# Patient Record
Sex: Female | Born: 1945
Health system: Southern US, Community
[De-identification: ages and names within clinical notes are randomized; demographics above are authoritative.]

## PROBLEM LIST (undated history)

## (undated) DIAGNOSIS — Z8489 Family history of other specified conditions: Secondary | ICD-10-CM

## (undated) DIAGNOSIS — R600 Localized edema: Secondary | ICD-10-CM

## (undated) DIAGNOSIS — I10 Essential (primary) hypertension: Secondary | ICD-10-CM

## (undated) DIAGNOSIS — M1990 Unspecified osteoarthritis, unspecified site: Secondary | ICD-10-CM

---

## 2002-01-19 ENCOUNTER — Ambulatory Visit (HOSPITAL_COMMUNITY): Admission: RE | Admit: 2002-01-19 | Discharge: 2002-01-19 | Payer: Self-pay | Admitting: Obstetrics and Gynecology

## 2002-01-19 ENCOUNTER — Encounter: Payer: Self-pay | Admitting: Obstetrics and Gynecology

## 2003-01-22 ENCOUNTER — Ambulatory Visit (HOSPITAL_COMMUNITY): Admission: RE | Admit: 2003-01-22 | Discharge: 2003-01-22 | Payer: Self-pay | Admitting: Obstetrics and Gynecology

## 2003-01-22 ENCOUNTER — Encounter: Payer: Self-pay | Admitting: Obstetrics and Gynecology

## 2010-08-21 ENCOUNTER — Encounter
Admission: RE | Admit: 2010-08-21 | Discharge: 2010-08-21 | Payer: Self-pay | Source: Home / Self Care | Attending: Internal Medicine | Admitting: Internal Medicine

## 2011-04-14 ENCOUNTER — Other Ambulatory Visit: Payer: Self-pay | Admitting: Internal Medicine

## 2011-04-14 DIAGNOSIS — Z1231 Encounter for screening mammogram for malignant neoplasm of breast: Secondary | ICD-10-CM

## 2011-08-23 ENCOUNTER — Ambulatory Visit
Admission: RE | Admit: 2011-08-23 | Discharge: 2011-08-23 | Disposition: A | Discharge status: Home / Self Care | Payer: Medicare Other | Source: Ambulatory Visit | Attending: Internal Medicine | Admitting: Internal Medicine

## 2011-08-23 DIAGNOSIS — Z1231 Encounter for screening mammogram for malignant neoplasm of breast: Secondary | ICD-10-CM

## 2012-07-27 ENCOUNTER — Other Ambulatory Visit: Payer: Self-pay | Admitting: Internal Medicine

## 2012-07-27 DIAGNOSIS — Z1231 Encounter for screening mammogram for malignant neoplasm of breast: Secondary | ICD-10-CM

## 2012-09-04 ENCOUNTER — Ambulatory Visit: Payer: Medicare Other

## 2012-10-05 ENCOUNTER — Ambulatory Visit: Payer: Medicare Other

## 2012-11-07 ENCOUNTER — Ambulatory Visit
Admission: RE | Admit: 2012-11-07 | Discharge: 2012-11-07 | Disposition: A | Discharge status: Home / Self Care | Payer: Medicare Other | Source: Ambulatory Visit | Attending: Internal Medicine | Admitting: Internal Medicine

## 2012-11-07 DIAGNOSIS — Z1231 Encounter for screening mammogram for malignant neoplasm of breast: Secondary | ICD-10-CM

## 2013-11-05 ENCOUNTER — Other Ambulatory Visit: Payer: Self-pay

## 2013-11-05 DIAGNOSIS — Z1231 Encounter for screening mammogram for malignant neoplasm of breast: Secondary | ICD-10-CM

## 2013-11-27 ENCOUNTER — Encounter (INDEPENDENT_AMBULATORY_CARE_PROVIDER_SITE_OTHER): Payer: Self-pay

## 2013-11-27 ENCOUNTER — Ambulatory Visit
Admission: RE | Admit: 2013-11-27 | Discharge: 2013-11-27 | Disposition: A | Discharge status: Home / Self Care | Payer: Medicare Other | Source: Ambulatory Visit

## 2013-11-27 DIAGNOSIS — Z1231 Encounter for screening mammogram for malignant neoplasm of breast: Secondary | ICD-10-CM

## 2014-08-05 DIAGNOSIS — E559 Vitamin D deficiency, unspecified: Secondary | ICD-10-CM | POA: Diagnosis not present

## 2014-08-05 DIAGNOSIS — I10 Essential (primary) hypertension: Secondary | ICD-10-CM | POA: Diagnosis not present

## 2014-08-05 DIAGNOSIS — Z1389 Encounter for screening for other disorder: Secondary | ICD-10-CM | POA: Diagnosis not present

## 2014-08-05 DIAGNOSIS — Z23 Encounter for immunization: Secondary | ICD-10-CM | POA: Diagnosis not present

## 2014-08-05 DIAGNOSIS — E538 Deficiency of other specified B group vitamins: Secondary | ICD-10-CM | POA: Diagnosis not present

## 2014-08-05 DIAGNOSIS — Z0000 Encounter for general adult medical examination without abnormal findings: Secondary | ICD-10-CM | POA: Diagnosis not present

## 2014-08-10 DIAGNOSIS — H52221 Regular astigmatism, right eye: Secondary | ICD-10-CM | POA: Diagnosis not present

## 2014-08-10 DIAGNOSIS — H524 Presbyopia: Secondary | ICD-10-CM | POA: Diagnosis not present

## 2014-08-10 DIAGNOSIS — H5203 Hypermetropia, bilateral: Secondary | ICD-10-CM | POA: Diagnosis not present

## 2014-10-30 DIAGNOSIS — E559 Vitamin D deficiency, unspecified: Secondary | ICD-10-CM | POA: Diagnosis not present

## 2014-10-30 DIAGNOSIS — I10 Essential (primary) hypertension: Secondary | ICD-10-CM | POA: Diagnosis not present

## 2014-10-30 DIAGNOSIS — E785 Hyperlipidemia, unspecified: Secondary | ICD-10-CM | POA: Diagnosis not present

## 2014-10-30 DIAGNOSIS — E538 Deficiency of other specified B group vitamins: Secondary | ICD-10-CM | POA: Diagnosis not present

## 2014-10-30 DIAGNOSIS — I5032 Chronic diastolic (congestive) heart failure: Secondary | ICD-10-CM | POA: Diagnosis not present

## 2015-01-13 ENCOUNTER — Other Ambulatory Visit: Payer: Self-pay

## 2015-01-13 DIAGNOSIS — Z1231 Encounter for screening mammogram for malignant neoplasm of breast: Secondary | ICD-10-CM

## 2015-01-24 DIAGNOSIS — E538 Deficiency of other specified B group vitamins: Secondary | ICD-10-CM | POA: Diagnosis not present

## 2015-01-24 DIAGNOSIS — I10 Essential (primary) hypertension: Secondary | ICD-10-CM | POA: Diagnosis not present

## 2015-01-24 DIAGNOSIS — E559 Vitamin D deficiency, unspecified: Secondary | ICD-10-CM | POA: Diagnosis not present

## 2015-01-31 DIAGNOSIS — N182 Chronic kidney disease, stage 2 (mild): Secondary | ICD-10-CM | POA: Diagnosis not present

## 2015-01-31 DIAGNOSIS — E785 Hyperlipidemia, unspecified: Secondary | ICD-10-CM | POA: Diagnosis not present

## 2015-01-31 DIAGNOSIS — I129 Hypertensive chronic kidney disease with stage 1 through stage 4 chronic kidney disease, or unspecified chronic kidney disease: Secondary | ICD-10-CM | POA: Diagnosis not present

## 2015-01-31 DIAGNOSIS — I5032 Chronic diastolic (congestive) heart failure: Secondary | ICD-10-CM | POA: Diagnosis not present

## 2015-02-06 ENCOUNTER — Ambulatory Visit
Admission: RE | Admit: 2015-02-06 | Discharge: 2015-02-06 | Disposition: A | Discharge status: Home / Self Care | Payer: Medicare Other | Source: Ambulatory Visit

## 2015-02-06 DIAGNOSIS — Z1231 Encounter for screening mammogram for malignant neoplasm of breast: Secondary | ICD-10-CM | POA: Diagnosis not present

## 2015-07-30 DIAGNOSIS — N182 Chronic kidney disease, stage 2 (mild): Secondary | ICD-10-CM | POA: Diagnosis not present

## 2015-07-30 DIAGNOSIS — E538 Deficiency of other specified B group vitamins: Secondary | ICD-10-CM | POA: Diagnosis not present

## 2015-07-30 DIAGNOSIS — E785 Hyperlipidemia, unspecified: Secondary | ICD-10-CM | POA: Diagnosis not present

## 2015-07-30 DIAGNOSIS — I129 Hypertensive chronic kidney disease with stage 1 through stage 4 chronic kidney disease, or unspecified chronic kidney disease: Secondary | ICD-10-CM | POA: Diagnosis not present

## 2015-07-30 DIAGNOSIS — I5032 Chronic diastolic (congestive) heart failure: Secondary | ICD-10-CM | POA: Diagnosis not present

## 2015-08-06 DIAGNOSIS — I129 Hypertensive chronic kidney disease with stage 1 through stage 4 chronic kidney disease, or unspecified chronic kidney disease: Secondary | ICD-10-CM | POA: Diagnosis not present

## 2015-08-06 DIAGNOSIS — N182 Chronic kidney disease, stage 2 (mild): Secondary | ICD-10-CM | POA: Diagnosis not present

## 2015-08-06 DIAGNOSIS — E785 Hyperlipidemia, unspecified: Secondary | ICD-10-CM | POA: Diagnosis not present

## 2015-08-06 DIAGNOSIS — E559 Vitamin D deficiency, unspecified: Secondary | ICD-10-CM | POA: Diagnosis not present

## 2015-08-06 DIAGNOSIS — Z23 Encounter for immunization: Secondary | ICD-10-CM | POA: Diagnosis not present

## 2015-08-06 DIAGNOSIS — M1611 Unilateral primary osteoarthritis, right hip: Secondary | ICD-10-CM | POA: Diagnosis not present

## 2015-08-06 DIAGNOSIS — M25551 Pain in right hip: Secondary | ICD-10-CM | POA: Diagnosis not present

## 2015-08-15 DIAGNOSIS — M1611 Unilateral primary osteoarthritis, right hip: Secondary | ICD-10-CM | POA: Diagnosis not present

## 2015-10-02 DIAGNOSIS — M1611 Unilateral primary osteoarthritis, right hip: Secondary | ICD-10-CM | POA: Diagnosis not present

## 2015-10-20 ENCOUNTER — Ambulatory Visit: Payer: Self-pay | Admitting: Orthopedic Surgery

## 2015-10-20 DIAGNOSIS — M1611 Unilateral primary osteoarthritis, right hip: Secondary | ICD-10-CM | POA: Diagnosis not present

## 2015-10-20 NOTE — H&P (Signed)
Victoria Owens DOB: 08-14-1945 Undefined / Language: [place] / Race: White Female Date of Admission:  11/10/2015 CC:  Right Hip Pain History of Present Illness The patient is a 70 year old female who comes in for a preoperative History and Physical. The patient is scheduled for a right total hip arthroplasty (anterior) to be performed by Dr. Gus RankinFrank V. Aluisio, MD at [facility] on 11-10-2015. The patient is a 70 year old female who presented with a hip problem. The patient was seen in referral from Dr. Penni BombardKendall.The patient reports right lateral hip problems including pain, tenderness and stiffness symptoms that have been present for year(s). The symptoms began without any known injury. Symptoms reported include: hip pain, night pain (inability to lie on right side), grinding, difficulty flexing hip, difficulty rotating hip, difficulty bearing weight and difficulty ambulating, while reported symptoms do not include fever, chills, warmth, redness or burning The patient describes the hip problem as dull and aching. Onset of symptoms was with symptoms now occuring constantly. The symptoms are described as moderate in severity.The patient feels as if their symptoms are notes that they are unchanged. Symptoms are exacerbated by movement, flexing hip, weight bearing and lying on the affected side. Symptoms are relieved by nonsteroidal anti-inflammatory drugs (topicals). The patient is not currently being treated for this problem. Pertinent medical history includes rheumatoid arthritis. Previous workup for this problem has included hip x-rays (ordered by pcp, and available on the [facility] system). Previous treatment for this problem has included corticosteroid injection (troch bursa injections). Unfortunately, her hips are causing extreme difficulty for her. She has pain at all times, including at rest. She is having pain at night. Her right hip is worse than her left hip and both are very painful. She is at a  stage now where it is having a very negative effect on her lifestyle. She states she cannot really tolerate the pain anymore. She is ready to proceed with surgery at this time. They have been treated conservatively in the past for the above stated problem and despite conservative measures, they continue to have progressive pain and severe functional limitations and dysfunction. They have failed non-operative management including home exercise, medications. It is felt that they would benefit from undergoing total joint replacement. Risks and benefits of the procedure have been discussed with the patient and they elect to proceed with surgery. There are no active contraindications to surgery such as ongoing infection or rapidly progressive neurological disease.   Problem List/Past Medical Problems Reconciled  Primary osteoarthritis of right hip (M16.11)  Hypertension  Allergies  Penicillins  Rash.  Family History Hypertension  mother Osteoarthritis  mother  Social History Alcohol use  never consumed alcohol Children  1 Current work status  retired Financial plannerDrug/Alcohol Rehab (Currently)  no Drug/Alcohol Rehab (Previously)  no Exercise  Exercises weekly; does running / walking Illicit drug use  no Living situation  live alone Marital status  single Pain Contract  no Tobacco / smoke exposure  no Tobacco use  never smoker Merchant navy officerAdvance Directives  Living Will, Healthcare POA  Medication History Multivitamin (Oral) Active. Vitamin C (Oral) Specific strength unknown - Active. Vitamin D3 (Oral) Specific strength unknown - Active. Pravastatin Sodium (10MG  Tablet, Oral) Active. HydroCHLOROthiazide (12.5MG  Capsule, Oral) Active. Ginkoba (40MG  Tablet, Oral) Active.  Past Surgical History No pertinent past surgical history    Review of Systems General Present- Fatigue. Not Present- Chills, Fever, Memory Loss, Night Sweats, Weight Gain and Weight Loss. Skin Not Present-  Eczema,  Hives, Itching, Lesions and Rash. HEENT Present- Headache. Not Present- Dentures, Double Vision, Hearing Loss, Tinnitus and Visual Loss. Respiratory Not Present- Allergies, Chronic Cough, Coughing up blood, Shortness of breath at rest and Shortness of breath with exertion. Cardiovascular Not Present- Chest Pain, Difficulty Breathing Lying Down, Murmur, Palpitations, Racing/skipping heartbeats and Swelling. Gastrointestinal Not Present- Abdominal Pain, Bloody Stool, Constipation, Diarrhea, Difficulty Swallowing, Heartburn, Jaundice, Loss of appetitie, Nausea and Vomiting. Female Genitourinary Not Present- Blood in Urine, Discharge, Flank Pain, Incontinence, Painful Urination, Urgency, Urinary frequency, Urinary Retention, Urinating at Night and Weak urinary stream. Musculoskeletal Present- Back Pain, Joint Pain, Joint Swelling, Morning Stiffness, Muscle Pain and Muscle Weakness. Not Present- Spasms. Neurological Present- Difficulty with balance, Dizziness and Weakness. Not Present- Blackout spells, Paralysis and Tremor. Psychiatric Not Present- Insomnia.  Vitals  Weight: 102 lb Height: 63in Body Surface Area: 1.45 m Body Mass Index: 18.07 kg/m  Pulse: 68 (Regular)  BP: 128/62 (Sitting, Left Arm, Standard)  Physical Exam General Mental Status -Alert, cooperative and good historian. General Appearance-pleasant, Not in acute distress. Orientation-Oriented X3. Build & Nutrition-Petite, Well nourished and Well developed.  Head and Neck Head-normocephalic, atraumatic . Neck Global Assessment - supple, no bruit auscultated on the right, no bruit auscultated on the left.  Eye Vision-Wears corrective lenses. Pupil - Bilateral-Regular and Round. Motion - Bilateral-EOMI.  ENMT Note: upper and lower dentures   Chest and Lung Exam Auscultation Breath sounds - clear at anterior chest wall and clear at posterior chest wall. Adventitious sounds - No  Adventitious sounds.  Cardiovascular Auscultation Rhythm - Regular rate and rhythm. Heart Sounds - S1 WNL and S2 WNL. Murmurs & Other Heart Sounds - Auscultation of the heart reveals - No Murmurs.  Abdomen Palpation/Percussion Tenderness - Abdomen is non-tender to palpation. Rigidity (guarding) - Abdomen is soft. Auscultation Auscultation of the abdomen reveals - Bowel sounds normal.  Female Genitourinary Note: Not done, not pertinent to present illness   Musculoskeletal Note: On exam, she is alert and oriented, in no apparent distress. Evaluation of her left hip shows flexion to 90. No internal rotation, about 10 degrees of external rotation, and 20 degrees of abduction. Right hip flexion is 70. No internal or external rotation. No abduction or adduction. Knee exams are normal. Pulse, sensation, and motor are intact. Gait pattern is severely antalgic.  RADIOGRAPHS AP pelvis, AP and lateral of the hips show that she has severe end-stage bone on bone arthritis of the right, worse than left hip. She has got a massive osteophyte formation and the hip is almost ankylosed on both sides, but the right is a little worse. She also has a large subchondral cysts on both sides.  Assessment & Plan  Primary osteoarthritis of right hip (M16.11)  Note:Surgical Plans: Right Total Hip Replacement - Anterior Approach  Disposition: Home  PCP: Dr. Shary DecampBrian McKenzie  IV TXA  Anesthesia Issues: NONE  Signed electronically by Beckey RutterAlezandrew L Arnett Duddy, III PA-C

## 2015-10-22 ENCOUNTER — Ambulatory Visit: Payer: Self-pay | Admitting: Orthopedic Surgery

## 2015-10-29 DIAGNOSIS — E559 Vitamin D deficiency, unspecified: Secondary | ICD-10-CM | POA: Diagnosis not present

## 2015-10-29 DIAGNOSIS — I129 Hypertensive chronic kidney disease with stage 1 through stage 4 chronic kidney disease, or unspecified chronic kidney disease: Secondary | ICD-10-CM | POA: Diagnosis not present

## 2015-10-29 DIAGNOSIS — E538 Deficiency of other specified B group vitamins: Secondary | ICD-10-CM | POA: Diagnosis not present

## 2015-10-29 DIAGNOSIS — N390 Urinary tract infection, site not specified: Secondary | ICD-10-CM | POA: Diagnosis not present

## 2015-10-31 NOTE — Progress Notes (Signed)
10-31-15 1705 Please review MD epic orders Scheduled procedure is left - Epic MD orders state Right Total Hip  -please clarify.

## 2015-10-31 NOTE — Patient Instructions (Addendum)
Victoria Owens  10/31/2015   Your procedure is scheduled on: 11-10-15  [facility]  [facility] to 3rd floor to  Short Stay Center at   1230 PM.  Call this number if you have problems the morning of surgery [phone]   Remember: ONLY 1 PERSON MAY GO WITH YOU TO SHORT STAY TO GET  READY MORNING OF YOUR SURGERY.  Do not eat food or drink liquids :After Midnight.     Take these medicines the morning of surgery with A SIP OF WATER: None. DO NOT TAKE ANY DIABETIC MEDICATIONS DAY OF YOUR SURGERY                               You may not have any metal on your body including hair pins and              piercings  Do not wear jewelry, make-up, lotions, powders or perfumes, deodorant             Do not wear nail polish.  Do not shave  48 hours prior to surgery.              Men may shave face and neck.   Do not bring valuables to the hospital. [facility] IS NOT             RESPONSIBLE   FOR VALUABLES.  Contacts, dentures or bridgework may not be worn into surgery.  Leave suitcase in the car. After surgery it may be brought to your room.     Patients discharged the day of surgery will not be allowed to drive home.  Name and phone number of your driver: Morene CrockerMarshall Green, brother- [phone]  Special Instructions: N/A              Please read over the following fact sheets you were given: _____________________________________________________________________             [facility] - Preparing [facility] Before surgery, you can play an important role.  Because skin is not sterile, your skin needs to be as free of germs as possible.  You can reduce the number of germs on your skin by washing with CHG (chlorahexidine gluconate) soap before surgery.  CHG is an antiseptic cleaner which kills germs and bonds with the skin to continue killing germs even after washing. Please DO NOT use if you have an allergy to CHG or antibacterial  soaps.  If your skin becomes reddened/irritated stop using the CHG and inform your nurse when you arrive at Short Stay. Do not shave (including legs and underarms) for at least 48 hours prior to the first CHG shower.  You may shave your face/neck. Please follow these instructions carefully:  1.  Shower with CHG Soap the night before surgery and the  morning of Surgery.  2.  If you choose to wash your hair, wash your hair first as usual with your  normal  shampoo.  3.  After you shampoo, rinse your hair and body thoroughly to remove the  shampoo.                           4.  Use CHG as you would any other liquid soap.  You can apply chg directly  to  the skin and wash                       Gently with a scrungie or clean washcloth.  5.  Apply the CHG Soap to your body ONLY FROM THE NECK DOWN.   Do not use on face/ open                           Wound or open sores. Avoid contact with eyes, ears mouth and genitals (private parts).                       Wash face,  Genitals (private parts) with your normal soap.             6.  Wash thoroughly, paying special attention to the area where your surgery  will be performed.  7.  Thoroughly rinse your body with warm water from the neck down.  8.  DO NOT shower/wash with your normal soap after using and rinsing off  the CHG Soap.                9.  Pat yourself dry with a clean towel.            10.  Wear clean pajamas.            11.  Place clean sheets on your bed the night of your first shower and do not  sleep with pets. Day of Surgery : Do not apply any lotions/deodorants the morning of surgery.  Please wear clean clothes to the hospital/surgery center.  FAILURE TO FOLLOW THESE INSTRUCTIONS MAY RESULT IN THE CANCELLATION OF YOUR SURGERY PATIENT SIGNATURE_________________________________  NURSE SIGNATURE__________________________________  ________________________________________________________________________   Adam Phenix  An  incentive spirometer is a tool that can help keep your lungs clear and active. This tool measures how well you are filling your lungs with each breath. Taking long deep breaths may help reverse or decrease the chance of developing breathing (pulmonary) problems (especially infection) following:  A long period of time when you are unable to move or be active. BEFORE THE PROCEDURE   If the spirometer includes an indicator to show your best effort, your nurse or respiratory therapist will set it to a desired goal.  If possible, sit up straight or lean slightly forward. Try not to slouch.  Hold the incentive spirometer in an upright position. INSTRUCTIONS FOR USE   Sit on the edge of your bed if possible, or sit up as far as you can in bed or on a chair.  Hold the incentive spirometer in an upright position.  Breathe out normally.  Place the mouthpiece in your mouth and seal your lips tightly around it.  Breathe in slowly and as deeply as possible, raising the piston or the ball toward the top of the column.  Hold your breath for 3-5 seconds or for as long as possible. Allow the piston or ball to fall to the bottom of the column.  Remove the mouthpiece from your mouth and breathe out normally.  Rest for a few seconds and repeat Steps 1 through 7 at least 10 times every 1-2 hours when you are awake. Take your time and take a few normal breaths between deep breaths.  The spirometer may include an indicator to show your best effort. Use the indicator as a goal to work toward during each repetition.  After each set  of 10 deep breaths, practice coughing to be sure your lungs are clear. If you have an incision (the cut made at the time of surgery), support your incision when coughing by placing a pillow or rolled up towels firmly against it. Once you are able to get out of bed, walk around indoors and cough well. You may stop using the incentive spirometer when instructed by your caregiver.   RISKS AND COMPLICATIONS  Take your time so you do not get dizzy or light-headed.  If you are in pain, you may need to take or ask for pain medication before doing incentive spirometry. It is harder to take a deep breath if you are having pain. AFTER USE  Rest and breathe slowly and easily.  It can be helpful to keep track of a log of your progress. Your caregiver can provide you with a simple table to help with this. If you are using the spirometer at home, follow these instructions: [place] IF:   You are having difficultly using the spirometer.  You have trouble using the spirometer as often as instructed.  Your pain medication is not giving enough relief while using the spirometer.  You develop fever of 100.5 F (38.1 C) or higher. SEEK IMMEDIATE MEDICAL CARE IF:   You cough up bloody sputum that had not been present before.  You develop fever of 102 F (38.9 C) or greater.  You develop worsening pain at or near the incision site. MAKE SURE YOU:   Understand these instructions.  Will watch your condition.  Will get help right away if you are not doing well or get worse. Document Released: 11/29/2006 Document Revised: 10/11/2011 Document Reviewed: 01/30/2007 ExitCare Patient Information 2014 ExitCare, [place].   ________________________________________________________________________  WHAT IS A BLOOD TRANSFUSION? Blood Transfusion Information  A transfusion is the replacement of blood or some of its parts. Blood is made up of multiple cells which provide different functions.  Red blood cells carry oxygen and are used for blood loss replacement.  White blood cells fight against infection.  Platelets control bleeding.  Plasma helps clot blood.  Other blood products are available for specialized needs, such as hemophilia or other clotting disorders. BEFORE THE TRANSFUSION  Who gives blood for transfusions?   Healthy volunteers who are fully evaluated  to make sure their blood is safe. This is blood bank blood. Transfusion therapy is the safest it has ever been in the practice of medicine. Before blood is taken from a donor, a complete history is taken to make sure that person has no history of diseases nor engages in risky social behavior (examples are intravenous drug use or sexual activity with multiple partners). The donor's travel history is screened to minimize risk of transmitting infections, such as malaria. The donated blood is tested for signs of infectious diseases, such as HIV and hepatitis. The blood is then tested to be sure it is compatible with you in order to minimize the chance of a transfusion reaction. If you or a relative donates blood, this is often done in anticipation of surgery and is not appropriate for emergency situations. It takes many days to process the donated blood. RISKS AND COMPLICATIONS Although transfusion therapy is very safe and saves many lives, the main dangers of transfusion include:   Getting an infectious disease.  Developing a transfusion reaction. This is an allergic reaction to something in the blood you were given. Every precaution is taken to prevent this. The decision to have a  blood transfusion has been considered carefully by your caregiver before blood is given. Blood is not given unless the benefits outweigh the risks. AFTER THE TRANSFUSION  Right after receiving a blood transfusion, you will usually feel much better and more energetic. This is especially true if your red blood cells have gotten low (anemic). The transfusion raises the level of the red blood cells which carry oxygen, and this usually causes an energy increase.  The nurse administering the transfusion will monitor you carefully for complications. HOME CARE INSTRUCTIONS  No special instructions are needed after a transfusion. You may find your energy is better. Speak with your caregiver about any limitations on activity for  underlying diseases you may have. SEEK MEDICAL CARE IF:   Your condition is not improving after your transfusion.  You develop redness or irritation at the intravenous (IV) site. SEEK IMMEDIATE MEDICAL CARE IF:  Any of the following symptoms occur over the next 12 hours:  Shaking chills.  You have a temperature by mouth above 102 F (38.9 C), not controlled by medicine.  Chest, back, or muscle pain.  People around you feel you are not acting correctly or are confused.  Shortness of breath or difficulty breathing.  Dizziness and fainting.  You get a rash or develop hives.  You have a decrease in urine output.  Your urine turns a dark color or changes to pink, red, or brown. Any of the following symptoms occur over the next 10 days:  You have a temperature by mouth above 102 F (38.9 C), not controlled by medicine.  Shortness of breath.  Weakness after normal activity.  The white part of the eye turns yellow (jaundice).  You have a decrease in the amount of urine or are urinating less often.  Your urine turns a dark color or changes to pink, red, or brown. Document Released: 07/16/2000 Document Revised: 10/11/2011 Document Reviewed: 03/04/2008 [facility] Patient Information 2014 [place], [place].  _______________________________________________________________________

## 2015-11-03 ENCOUNTER — Encounter (HOSPITAL_COMMUNITY)
Admission: RE | Admit: 2015-11-03 | Discharge: 2015-11-03 | Disposition: A | Discharge status: Home / Self Care | Payer: Medicare Other | Source: Ambulatory Visit | Attending: Orthopedic Surgery | Admitting: Orthopedic Surgery

## 2015-11-03 ENCOUNTER — Encounter (HOSPITAL_COMMUNITY): Payer: Self-pay

## 2015-11-03 DIAGNOSIS — I10 Essential (primary) hypertension: Secondary | ICD-10-CM | POA: Diagnosis not present

## 2015-11-03 DIAGNOSIS — Z01810 Encounter for preprocedural cardiovascular examination: Secondary | ICD-10-CM | POA: Diagnosis not present

## 2015-11-03 DIAGNOSIS — Z01812 Encounter for preprocedural laboratory examination: Secondary | ICD-10-CM | POA: Diagnosis not present

## 2015-11-03 HISTORY — DX: Unspecified osteoarthritis, unspecified site: M19.90

## 2015-11-03 HISTORY — DX: Essential (primary) hypertension: I10

## 2015-11-03 HISTORY — DX: Localized edema: R60.0

## 2015-11-03 HISTORY — DX: Family history of other specified conditions: Z84.89

## 2015-11-03 LAB — URINE MICROSCOPIC-ADD ON

## 2015-11-03 LAB — CBC
HEMATOCRIT: 38.8 % (ref 36.0–46.0)
HEMOGLOBIN: 13 g/dL (ref 12.0–15.0)
MCH: 29.7 pg (ref 26.0–34.0)
MCHC: 33.5 g/dL (ref 30.0–36.0)
MCV: 88.6 fL (ref 78.0–100.0)
Platelets: 257 10*3/uL (ref 150–400)
RBC: 4.38 MIL/uL (ref 3.87–5.11)
RDW: 12.7 % (ref 11.5–15.5)
WBC: 4.9 10*3/uL (ref 4.0–10.5)

## 2015-11-03 LAB — PROTIME-INR
INR: 0.99 (ref 0.00–1.49)
Prothrombin Time: 13.3 seconds (ref 11.6–15.2)

## 2015-11-03 LAB — URINALYSIS, ROUTINE W REFLEX MICROSCOPIC
Bilirubin Urine: NEGATIVE
Glucose, UA: NEGATIVE mg/dL
Ketones, ur: NEGATIVE mg/dL
Nitrite: NEGATIVE
Protein, ur: NEGATIVE mg/dL
SPECIFIC GRAVITY, URINE: 1.017 (ref 1.005–1.030)
pH: 6.5 (ref 5.0–8.0)

## 2015-11-03 LAB — COMPREHENSIVE METABOLIC PANEL
ALK PHOS: 82 U/L (ref 38–126)
ALT: 16 U/L (ref 14–54)
AST: 25 U/L (ref 15–41)
Albumin: 3.8 g/dL (ref 3.5–5.0)
Anion gap: 8 (ref 5–15)
BILIRUBIN TOTAL: 0.6 mg/dL (ref 0.3–1.2)
BUN: 15 mg/dL (ref 6–20)
CALCIUM: 9.2 mg/dL (ref 8.9–10.3)
CO2: 28 mmol/L (ref 22–32)
CREATININE: 0.87 mg/dL (ref 0.44–1.00)
Chloride: 106 mmol/L (ref 101–111)
GFR calc non Af Amer: >60 mL/min (ref >60)
Glucose, Bld: 100 mg/dL — ABNORMAL HIGH (ref 65–99)
Potassium: 3.2 mmol/L — ABNORMAL LOW (ref 3.5–5.1)
SODIUM: 142 mmol/L (ref 135–145)
TOTAL PROTEIN: 6.4 g/dL — AB (ref 6.5–8.1)

## 2015-11-03 LAB — ABO/RH: ABO/RH(D): B NEG

## 2015-11-03 LAB — SURGICAL PCR SCREEN
MRSA, PCR: NEGATIVE
STAPHYLOCOCCUS AUREUS: POSITIVE — AB

## 2015-11-03 LAB — APTT: aPTT: 25 seconds (ref 24–37)

## 2015-11-03 NOTE — Pre-Procedure Instructions (Signed)
11-03-15 1625 Pt. Notified of Positive Staph aureus and Mupirocin Ointment called to [facility] [address] [phone] -, will use as directed. Note viewable labs in Epic. Note urinalysis result. Note to fax [phone].

## 2015-11-04 NOTE — Pre-Procedure Instructions (Signed)
11-04-15 0930 Faxed not to Avel Peacerew [facility] on [phone], [phone] to clarify correct surgical site. Order set says right total hip, scheduled procedure is left total hip. Voice message to Meredith LeedsVelvet McBride to make aware clarification is needed for correct surgery site.

## 2015-11-05 DIAGNOSIS — E559 Vitamin D deficiency, unspecified: Secondary | ICD-10-CM | POA: Diagnosis not present

## 2015-11-05 DIAGNOSIS — Z1389 Encounter for screening for other disorder: Secondary | ICD-10-CM | POA: Diagnosis not present

## 2015-11-05 DIAGNOSIS — I129 Hypertensive chronic kidney disease with stage 1 through stage 4 chronic kidney disease, or unspecified chronic kidney disease: Secondary | ICD-10-CM | POA: Diagnosis not present

## 2015-11-05 DIAGNOSIS — Z0001 Encounter for general adult medical examination with abnormal findings: Secondary | ICD-10-CM | POA: Diagnosis not present

## 2015-11-05 DIAGNOSIS — E785 Hyperlipidemia, unspecified: Secondary | ICD-10-CM | POA: Diagnosis not present

## 2015-11-05 DIAGNOSIS — Z23 Encounter for immunization: Secondary | ICD-10-CM | POA: Diagnosis not present

## 2015-11-05 DIAGNOSIS — N182 Chronic kidney disease, stage 2 (mild): Secondary | ICD-10-CM | POA: Diagnosis not present

## 2015-11-05 DIAGNOSIS — Z01818 Encounter for other preprocedural examination: Secondary | ICD-10-CM | POA: Diagnosis not present

## 2015-11-10 ENCOUNTER — Inpatient Hospital Stay (HOSPITAL_COMMUNITY): Payer: Medicare Other | Admitting: Anesthesiology

## 2015-11-10 ENCOUNTER — Encounter (HOSPITAL_COMMUNITY): Payer: Self-pay | Admitting: *Deleted

## 2015-11-10 ENCOUNTER — Inpatient Hospital Stay (HOSPITAL_COMMUNITY): Payer: Medicare Other

## 2015-11-10 ENCOUNTER — Inpatient Hospital Stay (HOSPITAL_COMMUNITY)
Admission: RE | Admit: 2015-11-10 | Discharge: 2015-11-12 | DRG: 470 | Disposition: A | Discharge status: Home Health | Payer: Medicare Other | Source: Ambulatory Visit | Attending: Orthopedic Surgery | Admitting: Orthopedic Surgery

## 2015-11-10 ENCOUNTER — Encounter (HOSPITAL_COMMUNITY)
Admission: RE | Disposition: A | Discharge status: Home Health | Payer: Self-pay | Source: Ambulatory Visit | Attending: Orthopedic Surgery

## 2015-11-10 DIAGNOSIS — Z01812 Encounter for preprocedural laboratory examination: Secondary | ICD-10-CM

## 2015-11-10 DIAGNOSIS — Z471 Aftercare following joint replacement surgery: Secondary | ICD-10-CM | POA: Diagnosis not present

## 2015-11-10 DIAGNOSIS — I10 Essential (primary) hypertension: Secondary | ICD-10-CM | POA: Diagnosis present

## 2015-11-10 DIAGNOSIS — M169 Osteoarthritis of hip, unspecified: Secondary | ICD-10-CM | POA: Diagnosis present

## 2015-11-10 DIAGNOSIS — Z96641 Presence of right artificial hip joint: Secondary | ICD-10-CM | POA: Diagnosis not present

## 2015-11-10 DIAGNOSIS — M1611 Unilateral primary osteoarthritis, right hip: Principal | ICD-10-CM | POA: Diagnosis present

## 2015-11-10 DIAGNOSIS — Z79899 Other long term (current) drug therapy: Secondary | ICD-10-CM

## 2015-11-10 DIAGNOSIS — M25551 Pain in right hip: Secondary | ICD-10-CM | POA: Diagnosis present

## 2015-11-10 DIAGNOSIS — Z96649 Presence of unspecified artificial hip joint: Secondary | ICD-10-CM

## 2015-11-10 HISTORY — PX: TOTAL HIP ARTHROPLASTY: SHX124

## 2015-11-10 LAB — TYPE AND SCREEN
ABO/RH(D): B NEG
Antibody Screen: NEGATIVE

## 2015-11-10 SURGERY — ARTHROPLASTY, HIP, TOTAL, ANTERIOR APPROACH
Anesthesia: Spinal | Site: Hip | Laterality: Right

## 2015-11-10 MED ORDER — BUPIVACAINE HCL (PF) 0.5 % IJ SOLN
INTRAMUSCULAR | Status: DC | PRN
  Administered 2015-11-10: 3 mL

## 2015-11-10 MED ORDER — LACTATED RINGERS IV SOLN: INTRAVENOUS | Status: DC

## 2015-11-10 MED ORDER — TRANEXAMIC ACID 1000 MG/10ML IV SOLN
1000.0000 mg | Freq: Once | INTRAVENOUS | Status: AC
  Administered 2015-11-10: 1000 mg via INTRAVENOUS
  Filled 2015-11-10: qty 10

## 2015-11-10 MED ORDER — ACETAMINOPHEN 10 MG/ML IV SOLN
1000.0000 mg | Freq: Once | INTRAVENOUS | Status: AC
  Administered 2015-11-10: 1000 mg via INTRAVENOUS
  Filled 2015-11-10: qty 100

## 2015-11-10 MED ORDER — ONDANSETRON HCL 4 MG/2ML IJ SOLN: 4.0000 mg | Freq: Four times a day (QID) | INTRAMUSCULAR | Status: DC | PRN

## 2015-11-10 MED ORDER — ACETAMINOPHEN 10 MG/ML IV SOLN
INTRAVENOUS | Status: AC
Start: 2015-11-10 — End: 2015-11-10
  Filled 2015-11-10: qty 100

## 2015-11-10 MED ORDER — TRAMADOL HCL 50 MG PO TABS
50.0000 mg | ORAL_TABLET | Freq: Four times a day (QID) | ORAL | Status: DC | PRN
  Administered 2015-11-11 – 2015-11-12 (×2): 50 mg via ORAL
  Filled 2015-11-10 (×3): qty 1

## 2015-11-10 MED ORDER — BISACODYL 10 MG RE SUPP: 10.0000 mg | Freq: Every day | RECTAL | Status: DC | PRN

## 2015-11-10 MED ORDER — DIPHENHYDRAMINE HCL 12.5 MG/5ML PO ELIX
12.5000 mg | ORAL_SOLUTION | ORAL | Status: DC | PRN
  Filled 2015-11-10: qty 10

## 2015-11-10 MED ORDER — ACETAMINOPHEN 650 MG RE SUPP
650.0000 mg | Freq: Four times a day (QID) | RECTAL | Status: DC | PRN
  Filled 2015-11-10: qty 1

## 2015-11-10 MED ORDER — OXYCODONE HCL 5 MG PO TABS
5.0000 mg | ORAL_TABLET | ORAL | Status: DC | PRN
  Administered 2015-11-10 – 2015-11-11 (×2): 10 mg via ORAL
  Administered 2015-11-11: 5 mg via ORAL
  Filled 2015-11-10 (×3): qty 2

## 2015-11-10 MED ORDER — SODIUM CHLORIDE 0.9 % IV SOLN
INTRAVENOUS | Status: DC
  Administered 2015-11-10: 22:00:00 via INTRAVENOUS

## 2015-11-10 MED ORDER — ONDANSETRON HCL 4 MG/2ML IJ SOLN
INTRAMUSCULAR | Status: DC | PRN
  Administered 2015-11-10: 4 mg via INTRAVENOUS

## 2015-11-10 MED ORDER — FLEET ENEMA 7-19 GM/118ML RE ENEM: 1.0000 | ENEMA | Freq: Once | RECTAL | Status: DC | PRN

## 2015-11-10 MED ORDER — CEFAZOLIN SODIUM-DEXTROSE 2-4 GM/100ML-% IV SOLN
INTRAVENOUS | Status: AC
  Filled 2015-11-10: qty 100

## 2015-11-10 MED ORDER — METHOCARBAMOL 500 MG PO TABS
500.0000 mg | ORAL_TABLET | Freq: Four times a day (QID) | ORAL | Status: DC | PRN
  Filled 2015-11-10: qty 1

## 2015-11-10 MED ORDER — PHENOL 1.4 % MT LIQD: 1.0000 | OROMUCOSAL | Status: DC | PRN

## 2015-11-10 MED ORDER — DEXAMETHASONE SODIUM PHOSPHATE 10 MG/ML IJ SOLN
10.0000 mg | Freq: Once | INTRAMUSCULAR | Status: AC
  Administered 2015-11-10: 10 mg via INTRAVENOUS

## 2015-11-10 MED ORDER — HYDROCHLOROTHIAZIDE 12.5 MG PO CAPS
12.5000 mg | ORAL_CAPSULE | Freq: Every morning | ORAL | Status: DC
  Administered 2015-11-11 – 2015-11-12 (×2): 12.5 mg via ORAL
  Filled 2015-11-10 (×2): qty 1

## 2015-11-10 MED ORDER — EPHEDRINE SULFATE 50 MG/ML IJ SOLN
INTRAMUSCULAR | Status: AC
  Filled 2015-11-10: qty 1

## 2015-11-10 MED ORDER — MIDAZOLAM HCL 2 MG/2ML IJ SOLN
INTRAMUSCULAR | Status: AC
  Filled 2015-11-10: qty 2

## 2015-11-10 MED ORDER — PROPOFOL 500 MG/50ML IV EMUL
INTRAVENOUS | Status: DC | PRN
  Administered 2015-11-10: 25 ug/kg/min via INTRAVENOUS

## 2015-11-10 MED ORDER — PROPOFOL 10 MG/ML IV BOLUS
INTRAVENOUS | Status: AC
  Filled 2015-11-10: qty 20

## 2015-11-10 MED ORDER — SODIUM CHLORIDE 0.9 % IV SOLN: INTRAVENOUS | Status: DC

## 2015-11-10 MED ORDER — MENTHOL 3 MG MT LOZG: 1.0000 | LOZENGE | OROMUCOSAL | Status: DC | PRN

## 2015-11-10 MED ORDER — FENTANYL CITRATE (PF) 100 MCG/2ML IJ SOLN: 25.0000 ug | INTRAMUSCULAR | Status: DC | PRN

## 2015-11-10 MED ORDER — BUPIVACAINE HCL (PF) 0.25 % IJ SOLN
INTRAMUSCULAR | Status: AC
  Filled 2015-11-10: qty 30

## 2015-11-10 MED ORDER — CEFAZOLIN SODIUM-DEXTROSE 2-4 GM/100ML-% IV SOLN
2.0000 g | Freq: Four times a day (QID) | INTRAVENOUS | Status: AC
  Administered 2015-11-11 (×2): 2 g via INTRAVENOUS
  Filled 2015-11-10 (×2): qty 100

## 2015-11-10 MED ORDER — FENTANYL CITRATE (PF) 100 MCG/2ML IJ SOLN
INTRAMUSCULAR | Status: DC | PRN
  Administered 2015-11-10 (×2): 50 ug via INTRAVENOUS

## 2015-11-10 MED ORDER — MIDAZOLAM HCL 5 MG/5ML IJ SOLN
INTRAMUSCULAR | Status: DC | PRN
  Administered 2015-11-10 (×2): 1 mg via INTRAVENOUS

## 2015-11-10 MED ORDER — RIVAROXABAN 10 MG PO TABS
10.0000 mg | ORAL_TABLET | Freq: Every day | ORAL | Status: DC
  Administered 2015-11-11 – 2015-11-12 (×2): 10 mg via ORAL
  Filled 2015-11-10 (×3): qty 1

## 2015-11-10 MED ORDER — LIDOCAINE HCL (CARDIAC) 20 MG/ML IV SOLN
INTRAVENOUS | Status: AC
  Filled 2015-11-10: qty 5

## 2015-11-10 MED ORDER — METOCLOPRAMIDE HCL 5 MG/ML IJ SOLN: 5.0000 mg | Freq: Three times a day (TID) | INTRAMUSCULAR | Status: DC | PRN

## 2015-11-10 MED ORDER — LIDOCAINE HCL (CARDIAC) 20 MG/ML IV SOLN
INTRAVENOUS | Status: DC | PRN
  Administered 2015-11-10: 50 mg via INTRAVENOUS

## 2015-11-10 MED ORDER — FENTANYL CITRATE (PF) 100 MCG/2ML IJ SOLN
INTRAMUSCULAR | Status: AC
  Filled 2015-11-10: qty 2

## 2015-11-10 MED ORDER — MORPHINE SULFATE (PF) 10 MG/ML IV SOLN
1.0000 mg | INTRAVENOUS | Status: DC | PRN
Start: 2015-11-10 — End: 2015-11-11

## 2015-11-10 MED ORDER — METHOCARBAMOL 1000 MG/10ML IJ SOLN
500.0000 mg | Freq: Four times a day (QID) | INTRAVENOUS | Status: DC | PRN
  Administered 2015-11-10: 500 mg via INTRAVENOUS
  Filled 2015-11-10 (×2): qty 5

## 2015-11-10 MED ORDER — TRANEXAMIC ACID 1000 MG/10ML IV SOLN
1000.0000 mg | INTRAVENOUS | Status: AC
  Administered 2015-11-10: 1000 mg via INTRAVENOUS
  Filled 2015-11-10: qty 10

## 2015-11-10 MED ORDER — ACETAMINOPHEN 500 MG PO TABS
1000.0000 mg | ORAL_TABLET | Freq: Four times a day (QID) | ORAL | Status: AC
  Administered 2015-11-10 – 2015-11-11 (×4): 1000 mg via ORAL
  Filled 2015-11-10 (×8): qty 2

## 2015-11-10 MED ORDER — POLYETHYLENE GLYCOL 3350 17 G PO PACK: 17.0000 g | PACK | Freq: Every day | ORAL | Status: DC | PRN

## 2015-11-10 MED ORDER — ACETAMINOPHEN 325 MG PO TABS
650.0000 mg | ORAL_TABLET | Freq: Four times a day (QID) | ORAL | Status: DC | PRN
  Administered 2015-11-12: 650 mg via ORAL
  Filled 2015-11-10: qty 2

## 2015-11-10 MED ORDER — METOCLOPRAMIDE HCL 10 MG PO TABS: 5.0000 mg | ORAL_TABLET | Freq: Three times a day (TID) | ORAL | Status: DC | PRN

## 2015-11-10 MED ORDER — DEXAMETHASONE SODIUM PHOSPHATE 10 MG/ML IJ SOLN
10.0000 mg | Freq: Once | INTRAMUSCULAR | Status: AC
  Administered 2015-11-11: 10 mg via INTRAVENOUS
  Filled 2015-11-10: qty 1

## 2015-11-10 MED ORDER — ONDANSETRON HCL 4 MG PO TABS: 4.0000 mg | ORAL_TABLET | Freq: Four times a day (QID) | ORAL | Status: DC | PRN

## 2015-11-10 MED ORDER — BUPIVACAINE HCL (PF) 0.25 % IJ SOLN
INTRAMUSCULAR | Status: DC | PRN
  Administered 2015-11-10: 30 mL

## 2015-11-10 MED ORDER — DOCUSATE SODIUM 100 MG PO CAPS
100.0000 mg | ORAL_CAPSULE | Freq: Two times a day (BID) | ORAL | Status: DC
  Administered 2015-11-11 – 2015-11-12 (×3): 100 mg via ORAL

## 2015-11-10 MED ORDER — CHLORHEXIDINE GLUCONATE 4 % EX LIQD: 60.0000 mL | Freq: Once | CUTANEOUS | Status: DC

## 2015-11-10 MED ORDER — BUPIVACAINE HCL (PF) 0.5 % IJ SOLN
INTRAMUSCULAR | Status: AC
  Filled 2015-11-10: qty 30

## 2015-11-10 MED ORDER — CEFAZOLIN SODIUM-DEXTROSE 2-4 GM/100ML-% IV SOLN
2.0000 g | INTRAVENOUS | Status: AC
  Administered 2015-11-10: 2 g via INTRAVENOUS
  Filled 2015-11-10: qty 100

## 2015-11-10 MED ORDER — PHENYLEPHRINE HCL 10 MG/ML IJ SOLN
INTRAMUSCULAR | Status: DC | PRN
  Administered 2015-11-10 (×3): 40 ug via INTRAVENOUS

## 2015-11-10 MED ORDER — EPHEDRINE SULFATE 50 MG/ML IJ SOLN
INTRAMUSCULAR | Status: DC | PRN
  Administered 2015-11-10: 10 mg via INTRAVENOUS

## 2015-11-10 MED ORDER — LACTATED RINGERS IV SOLN
INTRAVENOUS | Status: DC | PRN
  Administered 2015-11-10 (×3): via INTRAVENOUS

## 2015-11-10 SURGICAL SUPPLY — 33 items
BAG DECANTER FOR FLEXI CONT (MISCELLANEOUS) ×2 IMPLANT
BAG ZIPLOCK 12X15 (MISCELLANEOUS) IMPLANT
BLADE SAG 18X100X1.27 (BLADE) ×2 IMPLANT
CAPT HIP TOTAL 2 ×2 IMPLANT
CLOTH BEACON ORANGE TIMEOUT ST (SAFETY) ×2 IMPLANT
COVER PERINEAL POST (MISCELLANEOUS) ×2 IMPLANT
DECANTER SPIKE VIAL GLASS SM (MISCELLANEOUS) ×2 IMPLANT
DRAPE STERI IOBAN 125X83 (DRAPES) ×2 IMPLANT
DRAPE U-SHAPE 47X51 STRL (DRAPES) ×4 IMPLANT
DRSG ADAPTIC 3X8 NADH LF (GAUZE/BANDAGES/DRESSINGS) ×2 IMPLANT
DRSG MEPILEX BORDER 4X4 (GAUZE/BANDAGES/DRESSINGS) ×2 IMPLANT
DRSG MEPILEX BORDER 4X8 (GAUZE/BANDAGES/DRESSINGS) ×2 IMPLANT
DURAPREP 26ML APPLICATOR (WOUND CARE) ×2 IMPLANT
ELECT REM PT RETURN 9FT ADLT (ELECTROSURGICAL) ×2
ELECTRODE REM PT RTRN 9FT ADLT (ELECTROSURGICAL) ×1 IMPLANT
EVACUATOR 1/8 PVC DRAIN (DRAIN) ×2 IMPLANT
GLOVE BIO SURGEON STRL SZ7.5 (GLOVE) ×2 IMPLANT
GLOVE BIO SURGEON STRL SZ8 (GLOVE) ×4 IMPLANT
GLOVE BIOGEL PI IND STRL 8 (GLOVE) ×2 IMPLANT
GLOVE BIOGEL PI INDICATOR 8 (GLOVE) ×2
GOWN STRL REUS W/TWL LRG LVL3 (GOWN DISPOSABLE) ×2 IMPLANT
GOWN STRL REUS W/TWL XL LVL3 (GOWN DISPOSABLE) ×2 IMPLANT
PACK ANTERIOR HIP CUSTOM (KITS) ×2 IMPLANT
STRIP CLOSURE SKIN 1/2X4 (GAUZE/BANDAGES/DRESSINGS) ×2 IMPLANT
SUT ETHIBOND NAB CT1 #1 30IN (SUTURE) ×2 IMPLANT
SUT MNCRL AB 4-0 PS2 18 (SUTURE) ×2 IMPLANT
SUT VIC AB 2-0 CT1 27 (SUTURE) ×2
SUT VIC AB 2-0 CT1 TAPERPNT 27 (SUTURE) ×2 IMPLANT
SUT VLOC 180 0 24IN GS25 (SUTURE) ×2 IMPLANT
SYR 50ML LL SCALE MARK (SYRINGE) IMPLANT
TRAY FOLEY W/METER SILVER 14FR (SET/KITS/TRAYS/PACK) ×2 IMPLANT
TRAY FOLEY W/METER SILVER 16FR (SET/KITS/TRAYS/PACK) IMPLANT
YANKAUER SUCT BULB TIP 10FT TU (MISCELLANEOUS) ×2 IMPLANT

## 2015-11-10 NOTE — Interval H&P Note (Signed)
History and Physical Interval Note:  11/10/2015 3:18 PM  Victoria Owens  has presented today for surgery, with the diagnosis of RIGHT HIP OA  The various methods of treatment have been discussed with the patient and family. After consideration of risks, benefits and other options for treatment, the patient has consented to  Procedure(s): RIGHT TOTAL HIP ARTHROPLASTY ANTERIOR APPROACH (Right) as a surgical intervention .  The patient's history has been reviewed, patient examined, no change in status, stable for surgery.  I have reviewed the patient's chart and labs.  Questions were answered to the patient's satisfaction.     Loanne DrillingALUISIO,Delina Kruczek V

## 2015-11-10 NOTE — Anesthesia Preprocedure Evaluation (Addendum)

## 2015-11-10 NOTE — H&P (View-Only) (Signed)
Victoria MemsPatricia W Owens DOB: 08-14-1945 Undefined / Language: [place] / Race: White Female Date of Admission:  11/10/2015 CC:  Right Hip Pain History of Present Illness The patient is a 70 year old female who comes in for a preoperative History and Physical. The patient is scheduled for a right total hip arthroplasty (anterior) to be performed by Dr. Gus RankinFrank V. Aluisio, MD at [facility] on 11-10-2015. The patient is a 70 year old female who presented with a hip problem. The patient was seen in referral from Dr. Penni BombardKendall.The patient reports right lateral hip problems including pain, tenderness and stiffness symptoms that have been present for year(s). The symptoms began without any known injury. Symptoms reported include: hip pain, night pain (inability to lie on right side), grinding, difficulty flexing hip, difficulty rotating hip, difficulty bearing weight and difficulty ambulating, while reported symptoms do not include fever, chills, warmth, redness or burning The patient describes the hip problem as dull and aching. Onset of symptoms was with symptoms now occuring constantly. The symptoms are described as moderate in severity.The patient feels as if their symptoms are notes that they are unchanged. Symptoms are exacerbated by movement, flexing hip, weight bearing and lying on the affected side. Symptoms are relieved by nonsteroidal anti-inflammatory drugs (topicals). The patient is not currently being treated for this problem. Pertinent medical history includes rheumatoid arthritis. Previous workup for this problem has included hip x-rays (ordered by pcp, and available on the [facility] system). Previous treatment for this problem has included corticosteroid injection (troch bursa injections). Unfortunately, her hips are causing extreme difficulty for her. She has pain at all times, including at rest. She is having pain at night. Her right hip is worse than her left hip and both are very painful. She is at a  stage now where it is having a very negative effect on her lifestyle. She states she cannot really tolerate the pain anymore. She is ready to proceed with surgery at this time. They have been treated conservatively in the past for the above stated problem and despite conservative measures, they continue to have progressive pain and severe functional limitations and dysfunction. They have failed non-operative management including home exercise, medications. It is felt that they would benefit from undergoing total joint replacement. Risks and benefits of the procedure have been discussed with the patient and they elect to proceed with surgery. There are no active contraindications to surgery such as ongoing infection or rapidly progressive neurological disease.   Problem List/Past Medical Problems Reconciled  Primary osteoarthritis of right hip (M16.11)  Hypertension  Allergies  Penicillins  Rash.  Family History Hypertension  mother Osteoarthritis  mother  Social History Alcohol use  never consumed alcohol Children  1 Current work status  retired Financial plannerDrug/Alcohol Rehab (Currently)  no Drug/Alcohol Rehab (Previously)  no Exercise  Exercises weekly; does running / walking Illicit drug use  no Living situation  live alone Marital status  single Pain Contract  no Tobacco / smoke exposure  no Tobacco use  never smoker Merchant navy officerAdvance Directives  Living Will, Healthcare POA  Medication History Multivitamin (Oral) Active. Vitamin C (Oral) Specific strength unknown - Active. Vitamin D3 (Oral) Specific strength unknown - Active. Pravastatin Sodium (10MG  Tablet, Oral) Active. HydroCHLOROthiazide (12.5MG  Capsule, Oral) Active. Ginkoba (40MG  Tablet, Oral) Active.  Past Surgical History No pertinent past surgical history    Review of Systems General Present- Fatigue. Not Present- Chills, Fever, Memory Loss, Night Sweats, Weight Gain and Weight Loss. Skin Not Present-  Eczema,  Hives, Itching, Lesions and Rash. HEENT Present- Headache. Not Present- Dentures, Double Vision, Hearing Loss, Tinnitus and Visual Loss. Respiratory Not Present- Allergies, Chronic Cough, Coughing up blood, Shortness of breath at rest and Shortness of breath with exertion. Cardiovascular Not Present- Chest Pain, Difficulty Breathing Lying Down, Murmur, Palpitations, Racing/skipping heartbeats and Swelling. Gastrointestinal Not Present- Abdominal Pain, Bloody Stool, Constipation, Diarrhea, Difficulty Swallowing, Heartburn, Jaundice, Loss of appetitie, Nausea and Vomiting. Female Genitourinary Not Present- Blood in Urine, Discharge, Flank Pain, Incontinence, Painful Urination, Urgency, Urinary frequency, Urinary Retention, Urinating at Night and Weak urinary stream. Musculoskeletal Present- Back Pain, Joint Pain, Joint Swelling, Morning Stiffness, Muscle Pain and Muscle Weakness. Not Present- Spasms. Neurological Present- Difficulty with balance, Dizziness and Weakness. Not Present- Blackout spells, Paralysis and Tremor. Psychiatric Not Present- Insomnia.  Vitals  Weight: 102 lb Height: 63in Body Surface Area: 1.45 m Body Mass Index: 18.07 kg/m  Pulse: 68 (Regular)  BP: 128/62 (Sitting, Left Arm, Standard)  Physical Exam General Mental Status -Alert, cooperative and good historian. General Appearance-pleasant, Not in acute distress. Orientation-Oriented X3. Build & Nutrition-Petite, Well nourished and Well developed.  Head and Neck Head-normocephalic, atraumatic . Neck Global Assessment - supple, no bruit auscultated on the right, no bruit auscultated on the left.  Eye Vision-Wears corrective lenses. Pupil - Bilateral-Regular and Round. Motion - Bilateral-EOMI.  ENMT Note: upper and lower dentures   Chest and Lung Exam Auscultation Breath sounds - clear at anterior chest wall and clear at posterior chest wall. Adventitious sounds - No  Adventitious sounds.  Cardiovascular Auscultation Rhythm - Regular rate and rhythm. Heart Sounds - S1 WNL and S2 WNL. Murmurs & Other Heart Sounds - Auscultation of the heart reveals - No Murmurs.  Abdomen Palpation/Percussion Tenderness - Abdomen is non-tender to palpation. Rigidity (guarding) - Abdomen is soft. Auscultation Auscultation of the abdomen reveals - Bowel sounds normal.  Female Genitourinary Note: Not done, not pertinent to present illness   Musculoskeletal Note: On exam, she is alert and oriented, in no apparent distress. Evaluation of her left hip shows flexion to 90. No internal rotation, about 10 degrees of external rotation, and 20 degrees of abduction. Right hip flexion is 70. No internal or external rotation. No abduction or adduction. Knee exams are normal. Pulse, sensation, and motor are intact. Gait pattern is severely antalgic.  RADIOGRAPHS AP pelvis, AP and lateral of the hips show that she has severe end-stage bone on bone arthritis of the right, worse than left hip. She has got a massive osteophyte formation and the hip is almost ankylosed on both sides, but the right is a little worse. She also has a large subchondral cysts on both sides.  Assessment & Plan  Primary osteoarthritis of right hip (M16.11)  Note:Surgical Plans: Right Total Hip Replacement - Anterior Approach  Disposition: Home  PCP: Dr. Shary DecampBrian McKenzie  IV TXA  Anesthesia Issues: NONE  Signed electronically by Beckey RutterAlezandrew L Teigen Parslow, III PA-C

## 2015-11-10 NOTE — Transfer of Care (Signed)
Immediate Anesthesia Transfer of Care Note  Patient: Victoria Owens  Procedure(s) Performed: Procedure(s): RIGHT TOTAL HIP ARTHROPLASTY ANTERIOR APPROACH (Right)  Patient Location: PACU  Anesthesia Type:Regional  Level of Consciousness: awake, alert  and oriented  Airway & Oxygen Therapy: Patient Spontanous Breathing and Patient connected to face mask oxygen  Post-op Assessment: Report given to RN and Post -op Vital signs reviewed and stable  Post vital signs: Reviewed and stable  Last Vitals:  Filed Vitals:   11/10/15 1222  BP: 140/64  Pulse: 78  Temp: 37.2 C  Resp: 16    Complications: No apparent anesthesia complications

## 2015-11-10 NOTE — Op Note (Signed)
OPERATIVE REPORT  PREOPERATIVE DIAGNOSIS: Osteoarthritis of the Right hip.   POSTOPERATIVE DIAGNOSIS: Osteoarthritis of the Right  hip.   PROCEDURE: Right total hip arthroplasty, anterior approach.   SURGEON: Victoria Gross, MD   ASSISTANT: Avel Peace, PA-C  ANESTHESIA:  Spinal  ESTIMATED BLOOD LOSS:-150 ml    DRAINS: Hemovac x1.   COMPLICATIONS: None   CONDITION: PACU - hemodynamically stable.   BRIEF CLINICAL NOTE: Victoria Owens is a 70 y.o. female who has advanced end-  stage arthritis of her Right  hip with progressively worsening pain and  dysfunction.The patient has failed nonoperative management and presents for  total hip arthroplasty.   PROCEDURE IN DETAIL: After successful administration of spinal  anesthetic, the traction boots for the [facility] bed were placed on both  feet and the patient was placed onto the [facility] bed, boots placed into the leg  holders. The Right hip was then isolated from the perineum with plastic  drapes and prepped and draped in the usual sterile fashion. ASIS and  greater trochanter were marked and a oblique incision was made, starting  at about 1 cm lateral and 2 cm distal to the ASIS and coursing towards  the anterior cortex of the femur. The skin was cut with a 10 blade  through subcutaneous tissue to the level of the fascia overlying the  tensor fascia lata muscle. The fascia was then incised in line with the  incision at the junction of the anterior third and posterior 2/3rd. The  muscle was teased off the fascia and then the interval between the TFL  and the rectus was developed. The Hohmann retractor was then placed at  the top of the femoral neck over the capsule. The vessels overlying the  capsule were cauterized and the fat on top of the capsule was removed.  A Hohmann retractor was then placed anterior underneath the rectus  femoris to give exposure to the entire anterior capsule. A T-shaped  capsulotomy was  performed. The edges were tagged and the femoral head  was identified.       Osteophytes are removed off the superior acetabulum.  The femoral neck was then cut in situ with an oscillating saw. Traction  was then applied to the left lower extremity utilizing the [facility]  traction. The femoral head was then removed. Retractors were placed  around the acetabulum and then circumferential removal of the labrum was  performed. Osteophytes were also removed. Reaming starts at 45 mm to  medialize and  Increased in 2 mm increments to 49 mm. We reamed in  approximately 40 degrees of abduction, 20 degrees anteversion. A 50 mm  pinnacle acetabular shell was then impacted in anatomic position under  fluoroscopic guidance with excellent purchase. We did not need to place  any additional dome screws. A 32 mm neutral + 4 marathon liner was then  placed into the acetabular shell.       The femoral lift was then placed along the lateral aspect of the femur  just distal to the vastus ridge. The leg was  externally rotated and capsule  was stripped off the inferior aspect of the femoral neck down to the  level of the lesser trochanter, this was done with electrocautery. The femur was lifted after this was performed. The  leg was then placed and extended in adducted position to essentially delivering the femur. We also removed the capsule superiorly and the  piriformis from the  piriformis fossa to gain excellent exposure of the  proximal femur. Rongeur was used to remove some cancellous bone to get  into the lateral portion of the proximal femur for placement of the  initial starter reamer. The starter broaches was placed  the starter broach  and was shown to go down the center of the canal. Broaching  with the  Corail system was then performed starting at size 8, coursing  Up to size 12. A size 12 had excellent torsional and rotational  and axial stability. The trial standard offset neck was then placed  with a  32 + 1 trial head. The hip was then reduced. We confirmed that  the stem was in the canal both on AP and lateral x-rays. It also has excellent sizing. The hip was reduced with outstanding stability through full extension, full external rotation,  and then flexion in adduction internal rotation. AP pelvis was taken  and the leg lengths were measured and found to be exactly equal. Hip  was then dislocated again and the femoral head and neck removed. The  femoral broach was removed. Size 12 Corail stem with a standard offset  neck was then impacted into the femur following native anteversion. Has  excellent purchase in the canal. Excellent torsional and rotational and  axial stability. It is confirmed to be in the canal on AP and lateral  fluoroscopic views. The 32 + 1 ceramic head was placed and the hip  reduced with outstanding stability. Again AP pelvis was taken and it  confirmed that the leg lengths were equal. The wound was then copiously  irrigated with saline solution and the capsule reattached and repaired  with Ethibond suture. 30 ml of .25% Bupivicaine injected into the capsule and into the edge of the tensor fascia lata as well as subcutaneous tissue. The fascia overlying the tensor fascia lata was  then closed with a running #1 V-Loc. Subcu was closed with interrupted  2-0 Vicryl and subcuticular running 4-0 Monocryl. Incision was cleaned  and dried. Steri-Strips and a bulky sterile dressing applied. Hemovac  drain was hooked to suction and then he was awakened and transported to  recovery in stable condition.        Please note that a surgical assistant was a medical necessity for this procedure to perform it in a safe and expeditious manner. Assistant was necessary to provide appropriate retraction of vital neurovascular structures and to prevent femoral fracture and allow for anatomic placement of the prosthesis.  Victoria GrossFrank Sebastiano Owens, M.D.

## 2015-11-10 NOTE — Anesthesia Procedure Notes (Signed)
Spinal Patient location during procedure: OR End time: 11/10/2015 5:46 PM Staffing Resident/CRNA: Noralyn Pick D Performed by: anesthesiologist and resident/CRNA  Preanesthetic Checklist Completed: patient identified, site marked, surgical consent, pre-op evaluation, timeout performed, IV checked, risks and benefits discussed and monitors and equipment checked Spinal Block Patient position: sitting Prep: Betadine Patient monitoring: heart rate, continuous pulse ox and blood pressure Approach: midline Location: L2-3 Injection technique: single-shot Needle Needle type: Sprotte  Needle gauge: 24 G Needle length: 9 cm Assessment Sensory level: T6 Additional Notes Expiration date of kit checked and confirmed. Patient tolerated procedure well, without complications.

## 2015-11-11 ENCOUNTER — Encounter (HOSPITAL_COMMUNITY): Payer: Self-pay | Admitting: Orthopedic Surgery

## 2015-11-11 LAB — BASIC METABOLIC PANEL
Anion gap: 8 (ref 5–15)
BUN: 15 mg/dL (ref 6–20)
CALCIUM: 8.9 mg/dL (ref 8.9–10.3)
CO2: 27 mmol/L (ref 22–32)
Chloride: 110 mmol/L (ref 101–111)
Creatinine, Ser: 0.74 mg/dL (ref 0.44–1.00)
GFR calc Af Amer: >60 mL/min (ref >60)
GLUCOSE: 139 mg/dL — AB (ref 65–99)
Potassium: 4.1 mmol/L (ref 3.5–5.1)
Sodium: 145 mmol/L (ref 135–145)

## 2015-11-11 LAB — CBC
HCT: 32.6 % — ABNORMAL LOW (ref 36.0–46.0)
Hemoglobin: 11.1 g/dL — ABNORMAL LOW (ref 12.0–15.0)
MCH: 30 pg (ref 26.0–34.0)
MCHC: 34 g/dL (ref 30.0–36.0)
MCV: 88.1 fL (ref 78.0–100.0)
PLATELETS: 223 10*3/uL (ref 150–400)
RBC: 3.7 MIL/uL — ABNORMAL LOW (ref 3.87–5.11)
RDW: 12.7 % (ref 11.5–15.5)
WBC: 9 10*3/uL (ref 4.0–10.5)

## 2015-11-11 MED ORDER — TRAMADOL HCL 50 MG PO TABS: 50.0000 mg | ORAL_TABLET | Freq: Four times a day (QID) | ORAL | Status: DC | PRN

## 2015-11-11 MED ORDER — LIP MEDEX EX OINT
TOPICAL_OINTMENT | CUTANEOUS | Status: AC
  Filled 2015-11-11: qty 7

## 2015-11-11 MED ORDER — OXYCODONE HCL 5 MG PO TABS: 5.0000 mg | ORAL_TABLET | ORAL | Status: DC | PRN

## 2015-11-11 MED ORDER — RIVAROXABAN 10 MG PO TABS: 10.0000 mg | ORAL_TABLET | Freq: Every day | ORAL | Status: DC

## 2015-11-11 MED ORDER — MORPHINE SULFATE (PF) 2 MG/ML IV SOLN
1.0000 mg | INTRAVENOUS | Status: DC | PRN
  Administered 2015-11-11: 1 mg via INTRAVENOUS
  Filled 2015-11-11: qty 1

## 2015-11-11 MED ORDER — METHOCARBAMOL 500 MG PO TABS: 500.0000 mg | ORAL_TABLET | Freq: Four times a day (QID) | ORAL | Status: DC | PRN

## 2015-11-11 NOTE — Discharge Summary (Signed)
Physician Discharge Summary   Patient ID: Victoria Owens MRN: [id number] DOB/AGE: [birth year] 69 y.o.  Admit date: 11/10/2015 Discharge date: 11/12/2015  Primary Diagnosis:  Osteoarthritis of the Right hip.  Admission Diagnoses:  Past Medical History  Diagnosis Date  . Family history of adverse reaction to anesthesia     awakens slowly from anesthesia  . Hypertension   . Arthritis     Osteoarthritis -hips  . Edema extremities     lower extremities flutuates- decreases if elevated.   Discharge Diagnoses:   Principal Problem:   OA (osteoarthritis) of hip  Estimated body mass index is 22.72 kg/(m^2) as calculated from the following:   Height as of this encounter: _0  (1.6 m).   Weight as of this encounter: 58.174 kg (128 lb 4 oz).  Procedure(s) (LRB): RIGHT TOTAL HIP ARTHROPLASTY ANTERIOR APPROACH (Right)   Consults: None  HPI: Victoria Owens is a 70 y.o. female who has advanced end-  stage arthritis of her Right hip with progressively worsening pain and  dysfunction.The patient has failed nonoperative management and presents for  total hip arthroplasty.   Laboratory Data: Admission on 11/10/2015  Component Date Value Ref Range Status  . WBC 11/11/2015 9.0  4.0 - 10.5 K/uL Final  . RBC 11/11/2015 3.70* 3.87 - 5.11 MIL/uL Final  . Hemoglobin 11/11/2015 11.1* 12.0 - 15.0 g/dL Final  . HCT 11/11/2015 32.6* 36.0 - 46.0 % Final  . MCV 11/11/2015 88.1  78.0 - 100.0 fL Final  . MCH 11/11/2015 30.0  26.0 - 34.0 pg Final  . MCHC 11/11/2015 34.0  30.0 - 36.0 g/dL Final  . RDW 11/11/2015 12.7  11.5 - 15.5 % Final  . Platelets 11/11/2015 223  150 - 400 K/uL Final  . Sodium 11/11/2015 145  135 - 145 mmol/L Final  . Potassium 11/11/2015 4.1  3.5 - 5.1 mmol/L Final  . Chloride 11/11/2015 110  101 - 111 mmol/L Final  . CO2 11/11/2015 27  22 - 32 mmol/L Final  . Glucose, Bld 11/11/2015 139* 65 - 99 mg/dL Final  . BUN 11/11/2015 15  6 - 20 mg/dL Final  . Creatinine, Ser  11/11/2015 0.74  0.44 - 1.00 mg/dL Final  . Calcium 11/11/2015 8.9  8.9 - 10.3 mg/dL Final  . GFR calc non Af Amer 11/11/2015 >60  >60 mL/min Final  . GFR calc Af Amer 11/11/2015 >60  >60 mL/min Final   Comment: (NOTE) The eGFR has been calculated using the CKD EPI equation. This calculation has not been validated in all clinical situations. eGFR's persistently <60 mL/min signify possible Chronic Kidney Disease.   Georgiann Hahn gap 11/11/2015 8  5 - 15 Final  Hospital Outpatient Visit on 11/03/2015  Component Date Value Ref Range Status  . MRSA, PCR 11/03/2015 NEGATIVE  NEGATIVE Final  . Staphylococcus aureus 11/03/2015 POSITIVE* NEGATIVE Final   Comment:        The Xpert SA Assay (FDA approved for NASAL specimens in patients over 23 years of age), is one component of a comprehensive surveillance program.  Test performance has been validated by [facility] for patients greater than or equal to 22 year old. It is not intended to diagnose infection nor to guide or monitor treatment.   Marland Kitchen aPTT 11/03/2015 25  24 - 37 seconds Final  . WBC 11/03/2015 4.9  4.0 - 10.5 K/uL Final  . RBC 11/03/2015 4.38  3.87 - 5.11 MIL/uL Final  . Hemoglobin 11/03/2015 13.0  12.0 - 15.0  g/dL Final  . HCT 11/03/2015 38.8  36.0 - 46.0 % Final  . MCV 11/03/2015 88.6  78.0 - 100.0 fL Final  . MCH 11/03/2015 29.7  26.0 - 34.0 pg Final  . MCHC 11/03/2015 33.5  30.0 - 36.0 g/dL Final  . RDW 11/03/2015 12.7  11.5 - 15.5 % Final  . Platelets 11/03/2015 257  150 - 400 K/uL Final  . Sodium 11/03/2015 142  135 - 145 mmol/L Final  . Potassium 11/03/2015 3.2* 3.5 - 5.1 mmol/L Final  . Chloride 11/03/2015 106  101 - 111 mmol/L Final  . CO2 11/03/2015 28  22 - 32 mmol/L Final  . Glucose, Bld 11/03/2015 100* 65 - 99 mg/dL Final  . BUN 11/03/2015 15  6 - 20 mg/dL Final  . Creatinine, Ser 11/03/2015 0.87  0.44 - 1.00 mg/dL Final  . Calcium 11/03/2015 9.2  8.9 - 10.3 mg/dL Final  . Total Protein 11/03/2015 6.4* 6.5 - 8.1  g/dL Final  . Albumin 11/03/2015 3.8  3.5 - 5.0 g/dL Final  . AST 11/03/2015 25  15 - 41 U/L Final  . ALT 11/03/2015 16  14 - 54 U/L Final  . Alkaline Phosphatase 11/03/2015 82  38 - 126 U/L Final  . Total Bilirubin 11/03/2015 0.6  0.3 - 1.2 mg/dL Final  . GFR calc non Af Amer 11/03/2015 >60  >60 mL/min Final  . GFR calc Af Amer 11/03/2015 >60  >60 mL/min Final   Comment: (NOTE) The eGFR has been calculated using the CKD EPI equation. This calculation has not been validated in all clinical situations. eGFR's persistently <60 mL/min signify possible Chronic Kidney Disease.   . Anion gap 11/03/2015 8  5 - 15 Final  . Prothrombin Time 11/03/2015 13.3  11.6 - 15.2 seconds Final  . INR 11/03/2015 0.99  0.00 - 1.49 Final  . ABO/RH(D) 11/03/2015 B NEG   Final  . Antibody Screen 11/03/2015 NEG   Final  . Sample Expiration 11/03/2015 11/13/2015   Final  . Extend sample reason 11/03/2015 NO TRANSFUSIONS OR PREGNANCY IN THE PAST 3 MONTHS   Final  . Color, Urine 11/03/2015 YELLOW  YELLOW Final  . APPearance 11/03/2015 CLEAR  CLEAR Final  . Specific Gravity, Urine 11/03/2015 1.017  1.005 - 1.030 Final  . pH 11/03/2015 6.5  5.0 - 8.0 Final  . Glucose, UA 11/03/2015 NEGATIVE  NEGATIVE mg/dL Final  . Hgb urine dipstick 11/03/2015 TRACE* NEGATIVE Final  . Bilirubin Urine 11/03/2015 NEGATIVE  NEGATIVE Final  . Ketones, ur 11/03/2015 NEGATIVE  NEGATIVE mg/dL Final  . Protein, ur 11/03/2015 NEGATIVE  NEGATIVE mg/dL Final  . Nitrite 11/03/2015 NEGATIVE  NEGATIVE Final  . Leukocytes, UA 11/03/2015 SMALL* NEGATIVE Final  . ABO/RH(D) 11/03/2015 B NEG   Final  . Squamous Epithelial / LPF 11/03/2015 0-5* NONE SEEN Final  . WBC, UA 11/03/2015 0-5  0 - 5 WBC/hpf Final  . RBC / HPF 11/03/2015 0-5  0 - 5 RBC/hpf Final  . Bacteria, UA 11/03/2015 FEW* NONE SEEN Final     X-Rays:Dg Pelvis Portable  11/10/2015  CLINICAL DATA:  Status post right hip arthroplasty. Initial encounter. EXAM: PORTABLE PELVIS 1-2  VIEWS COMPARISON:  None. FINDINGS: There has been interval placement of a right hip arthroplasty, noted in expected alignment, without evidence of fracture or loosening. Overlying postoperative soft tissue air is seen, with associated drainage. Degenerative change is noted at the left hip, with diffuse joint space narrowing and associated sclerosis. The upper pelvis is not  well assessed due to the patient's overlying hand. IMPRESSION: Right hip arthroplasty in expected alignment, without evidence of fracture or loosening. Electronically Signed   By: Garald Balding M.D.   On: 11/10/2015 19:56   Dg C-arm 1-60 Min-no Report  11/10/2015  CLINICAL DATA: surgery C-ARM 1-60 MINUTES Fluoroscopy was utilized by the requesting physician.  No radiographic interpretation.    EKG: Orders placed or performed during the hospital encounter of 11/03/15  . EKG 12-Lead  . EKG 12-Lead     Hospital Course: Patient was admitted to [facility] and taken to the OR and underwent the above state procedure without complications.  Patient tolerated the procedure well and was later transferred to the recovery room and then to the orthopaedic floor for postoperative care.  They were given PO and IV analgesics for pain control following their surgery.  They were given 24 hours of postoperative antibiotics of  Anti-infectives    Start     Dose/Rate Route Frequency Ordered Stop   11/10/15 2345  ceFAZolin (ANCEF) IVPB 2g/100 mL premix     2 g 200 mL/hr over 30 Minutes Intravenous Every 6 hours 11/10/15 2128 11/11/15 0659   11/10/15 1230  ceFAZolin (ANCEF) IVPB 2g/100 mL premix     2 g 200 mL/hr over 30 Minutes Intravenous On call to O.R. 11/10/15 1225 11/10/15 1748     and started on DVT prophylaxis in the form of Xarelto.   PT and OT were ordered for total hip protocol.  The patient was allowed to be WBAT with therapy. Discharge planning was consulted to help with postop disposition and equipment needs.  Patient had a  good night on the evening of surgery.  They started to get up OOB with therapy on day one.  Hemovac drain was pulled without difficulty.  Continued to work with therapy into day two.  Dressing was changed on day two and the incision was healing well.  Patient was seen in rounds on POD 2 and was ready to go home.  Discharge home with home health Diet - regular diet Follow up - in 2 weeks Activity - WBAT Disposition - Home Condition Upon Discharge - Good D/C Meds - See DC Summary DVT Prophylaxis - Xarelto  Discharge Instructions    Call MD / Call 911    Complete by:  As directed   If you experience chest pain or shortness of breath, CALL 911 and be transported to the hospital emergency room.  If you develope a fever above 101 F, pus (white drainage) or increased drainage or redness at the wound, or calf pain, call your surgeon's office.     Change dressing    Complete by:  As directed   You may change your dressing dressing daily with sterile 4 x 4 inch gauze dressing and paper tape.  Do not submerge the incision under water.     Constipation Prevention    Complete by:  As directed   Drink plenty of fluids.  Prune juice may be helpful.  You may use a stool softener, such as Colace (over the counter) 100 mg twice a day.  Use MiraLax (over the counter) for constipation as needed.     Diet - low sodium heart healthy    Complete by:  As directed      Discharge instructions    Complete by:  As directed   Pick up stool softner and laxative for home use following surgery while on pain medications. Do not  submerge incision under water. Please use good hand washing techniques while changing dressing each day. May shower starting three days after surgery. Please use a clean towel to pat the incision dry following showers. Continue to use ice for pain and swelling after surgery. Do not use any lotions or creams on the incision until instructed by your surgeon.  Total Hip Protocol.  Take Xarelto  for two and a half more weeks, then discontinue Xarelto. Once the patient has completed the blood thinner regimen, then take a Baby 81 mg Aspirin daily for three more weeks.  Postoperative Constipation Protocol  Constipation - defined medically as fewer than three stools per week and severe constipation as less than one stool per week.  One of the most common issues patients have following surgery is constipation.  Even if you have a regular bowel pattern at home, your normal regimen is likely to be disrupted due to multiple reasons following surgery.  Combination of anesthesia, postoperative narcotics, change in appetite and fluid intake all can affect your bowels.  In order to avoid complications following surgery, here are some recommendations in order to help you during your recovery period.  Colace (docusate) - Pick up an over-the-counter form of Colace or another stool softener and take twice a day as long as you are requiring postoperative pain medications.  Take with a full glass of water daily.  If you experience loose stools or diarrhea, hold the colace until you stool forms back up.  If your symptoms do not get better within 1 week or if they get worse, check with your doctor.  Dulcolax (bisacodyl) - Pick up over-the-counter and take as directed by the product packaging as needed to assist with the movement of your bowels.  Take with a full glass of water.  Use this product as needed if not relieved by Colace only.   MiraLax (polyethylene glycol) - Pick up over-the-counter to have on hand.  MiraLax is a solution that will increase the amount of water in your bowels to assist with bowel movements.  Take as directed and can mix with a glass of water, juice, soda, coffee, or tea.  Take if you go more than two days without a movement. Do not use MiraLax more than once per day. Call your doctor if you are still constipated or irregular after using this medication for 7 days in a row.  If you  continue to have problems with postoperative constipation, please contact the office for further assistance and recommendations.  If you experience "the worst abdominal pain ever" or develop nausea or vomiting, please contact the office immediatly for further recommendations for treatment.     Do not sit on low chairs, stoools or toilet seats, as it may be difficult to get up from low surfaces    Complete by:  As directed      Driving restrictions    Complete by:  As directed   No driving until released by the physician.     Increase activity slowly as tolerated    Complete by:  As directed      Lifting restrictions    Complete by:  As directed   No lifting until released by the physician.     Patient may shower    Complete by:  As directed   You may shower without a dressing once there is no drainage.  Do not wash over the wound.  If drainage remains, do not shower until drainage stops.  TED hose    Complete by:  As directed   Use stockings (TED hose) for 3 weeks on both leg(s).  You may remove them at night for sleeping.     Weight bearing as tolerated    Complete by:  As directed   Laterality:  right  Extremity:  Lower            Medication List    STOP taking these medications        LECITHIN PO     multivitamin with minerals Tabs tablet     VITAMIN C PO     VITAMIN D PO      TAKE these medications        hydrochlorothiazide 12.5 MG capsule  Commonly known as:  MICROZIDE  Take 12.5 mg by mouth every morning.     methocarbamol 500 MG tablet  Commonly known as:  ROBAXIN  Take 1 tablet (500 mg total) by mouth every 6 (six) hours as needed for muscle spasms.     oxyCODONE 5 MG immediate release tablet  Commonly known as:  Oxy IR/ROXICODONE  Take 1-2 tablets (5-10 mg total) by mouth every 4 (four) hours as needed for moderate pain or severe pain.     rivaroxaban 10 MG Tabs tablet  Commonly known as:  XARELTO  Take 1 tablet (10 mg total) by mouth daily with  breakfast. Take Xarelto for two and a half more weeks, then discontinue Xarelto. Once the patient has completed the blood thinner regimen, then take a Baby 81 mg Aspirin daily for three more weeks.     traMADol 50 MG tablet  Commonly known as:  ULTRAM  Take 1-2 tablets (50-100 mg total) by mouth every 6 (six) hours as needed (mild to moderate pain).           Follow-up Information    Follow up with [facility].   Why:  rolling walker   Contact information:   [address] [place] [address] [phone]       Follow up with [facility].   Why:  home health physical therapy   Contact information:   [place]  [id number] [phone]       Follow up with Gearlean Alf, MD. Schedule an appointment as soon as possible for a visit on 11/25/2015.   Specialty:  Orthopedic Surgery   Why:  Call office at [phone] to setup appointment on Tuesday 11/25/2015 with Dr. Wynelle Link.   Contact information:   [address] [place] [address] [phone]       Signed: Arlee Muslim, PA-C Orthopaedic Surgery 11/11/2015, 10:35 PM

## 2015-11-11 NOTE — Progress Notes (Addendum)
Physical Therapy Treatment Patient Details Name: Victoria Owens MRN: [id number] DOB: 01-21-1946 Today's Date: 11/11/2015    History of Present Illness s/p R DA THA    PT Comments    Patient demonstrating  Decreased safety, decreased problem solving and more confusion. Required multiple instructions for safety. Patient was given her bag to get brush out, hasd 2 pill bottles and patient stated"there are the pills I need to take for my blood pressure". RN notified, pills placed away from patient.  No family present to obtain any info in regards to AMS . If confusion persists, may need to consider SNF as patient lives alone.  Follow Up Recommendations   (may need to consider SNF if confusion continues as patient lives alone)     Equipment Recommendations  Rolling walker with 5" wheels    Recommendations for Other Services       Precautions / Restrictions Precautions Precautions: Fall Precaution Comments: has become more confused    Mobility  Bed Mobility Overal bed mobility: Needs Assistance Bed Mobility: Sit to Supine       Sit to supine: Min assist   General bed mobility comments: support R leg  Transfers Overall transfer level: Needs assistance Equipment used: Rolling walker (2 wheeled) Transfers: Sit to/from Stand Sit to Stand: Min guard         General transfer comment: for safety; cues for UE placement, multimodal  for safety  Ambulation/Gait Ambulation/Gait assistance: Min guard Ambulation Distance (Feet): 100 Feet Assistive device: Rolling walker (2 wheeled) Gait Pattern/deviations: Step-to pattern;Step-through pattern     General Gait Details: multimodal cues for sequqnce and safety   Stairs            Wheelchair Mobility    Modified Rankin (Stroke Patients Only)       Balance                                    Cognition Arousal/Alertness: Awake/alert   Overall Cognitive Status: No family/caregiver present to  determine baseline cognitive functioning Area of Impairment: Memory;Following commands;Safety/judgement;Awareness;Problem solving     Memory: Decreased recall of precautions Following Commands: Follows one step commands inconsistently     Problem Solving: Difficulty sequencing;Requires verbal cues General Comments: patient got up from toilet, turned around and did not have RW, on several times, strong verbal cues to keep  hold of RW, multimodal cues for safety.    Exercises      General Comments        Pertinent Vitals/Pain Faces Pain Scale: Hurts a little bit Pain Location: R hip Pain Descriptors / Indicators: Sore Pain Intervention(s): Repositioned;Ice applied    Home Living                      Prior Function            PT Goals (current goals can now be found in the care plan section) Progress towards PT goals: Progressing toward goals    Frequency  7X/week    PT Plan Current plan remains appropriate    Co-evaluation             End of Session Equipment Utilized During Treatment: Gait belt Activity Tolerance: Patient tolerated treatment well Patient left: in bed;with call bell/phone within reach;with bed alarm set     Time: 1330-1405 PT Time Calculation (min) (ACUTE ONLY): 35 min  Charges:  $Gait Training:  8-22 mins $Self Care/Home Management: 2023/04/07                    G Codes:      Rada Hay 11/11/2015, 5:34 PM Blanchard Kelch PT [phone]

## 2015-11-11 NOTE — Discharge Instructions (Addendum)
° °Dr. Frank Aluisio °Total Joint Specialist °[facility] °[address] °[place], [place] [address] °[phone] ° °ANTERIOR APPROACH TOTAL HIP REPLACEMENT POSTOPERATIVE DIRECTIONS ° ° °Hip Rehabilitation, Guidelines Following Surgery  °The results of a hip operation are greatly improved after range of motion and muscle strengthening exercises. Follow all safety measures which are given to protect your hip. If any of these exercises cause increased pain or swelling in your joint, decrease the amount until you are comfortable again. Then slowly increase the exercises. Call your caregiver if you have problems or questions.  ° °HOME CARE INSTRUCTIONS  °Remove items at home which could result in a fall. This includes throw rugs or furniture in walking pathways.  °· ICE to the affected hip every three hours for 30 minutes at a time and then as needed for pain and swelling.  Continue to use ice on the hip for pain and swelling from surgery. You may notice swelling that will progress down to the foot and ankle.  This is normal after surgery.  Elevate the leg when you are not up walking on it.   °· Continue to use the breathing machine which will help keep your temperature down.  It is common for your temperature to cycle up and down following surgery, especially at night when you are not up moving around and exerting yourself.  The breathing machine keeps your lungs expanded and your temperature down. ° ° °DIET °You may resume your previous home diet once your are discharged from the hospital. ° °DRESSING / WOUND CARE / SHOWERING °You may shower 3 days after surgery, but keep the wounds dry during showering.  You may use an occlusive plastic wrap (Press'n Seal for example), NO SOAKING/SUBMERGING IN THE BATHTUB.  If the bandage gets wet, change with a clean dry gauze.  If the incision gets wet, pat the wound dry with a clean towel. °You may start showering once you are discharged home but do not  submerge the incision under water. Just pat the incision dry and apply a dry gauze dressing on daily. °Change the surgical dressing daily and reapply a dry dressing each time. ° °ACTIVITY °Walk with your walker as instructed. °Use walker as long as suggested by your caregivers. °Avoid periods of inactivity such as sitting longer than an hour when not asleep. This helps prevent blood clots.  °You may resume a sexual relationship in one month or when given the OK by your doctor.  °You may return to work once you are cleared by your doctor.  °Do not drive a car for 6 weeks or until released by you surgeon.  °Do not drive while taking narcotics. ° °WEIGHT BEARING °Weight bearing as tolerated with assist device (walker, cane, etc) as directed, use it as long as suggested by your surgeon or therapist, typically at least 4-6 weeks. ° °POSTOPERATIVE CONSTIPATION PROTOCOL °Constipation - defined medically as fewer than three stools per week and severe constipation as less than one stool per week. ° °One of the most common issues patients have following surgery is constipation.  Even if you have a regular bowel pattern at home, your normal regimen is likely to be disrupted due to multiple reasons following surgery.  Combination of anesthesia, postoperative narcotics, change in appetite and fluid intake all can affect your bowels.  In order to avoid complications following surgery, here are some recommendations in order to help you during your recovery period. ° °Colace (docusate) - Pick up an over-the-counter   form of Colace or another stool softener and take twice a day as long as you are requiring postoperative pain medications.  Take with a full glass of water daily.  If you experience loose stools or diarrhea, hold the colace until you stool forms back up.  If your symptoms do not get better within 1 week or if they get worse, check with your doctor. ° °Dulcolax (bisacodyl) - Pick up over-the-counter and take as directed  by the product packaging as needed to assist with the movement of your bowels.  Take with a full glass of water.  Use this product as needed if not relieved by Colace only.  ° °MiraLax (polyethylene glycol) - Pick up over-the-counter to have on hand.  MiraLax is a solution that will increase the amount of water in your bowels to assist with bowel movements.  Take as directed and can mix with a glass of water, juice, soda, coffee, or tea.  Take if you go more than two days without a movement. °Do not use MiraLax more than once per day. Call your doctor if you are still constipated or irregular after using this medication for 7 days in a row. ° °If you continue to have problems with postoperative constipation, please contact the office for further assistance and recommendations.  If you experience "the worst abdominal pain ever" or develop nausea or vomiting, please contact the office immediatly for further recommendations for treatment. ° °ITCHING ° If you experience itching with your medications, try taking only a single pain pill, or even half a pain pill at a time.  You can also use Benadryl over the counter for itching or also to help with sleep.  ° °TED HOSE STOCKINGS °Wear the elastic stockings on both legs for three weeks following surgery during the day but you may remove then at night for sleeping. ° °MEDICATIONS °See your medication summary on the “After Visit Summary” that the nursing staff will review with you prior to discharge.  You may have some home medications which will be placed on hold until you complete the course of blood thinner medication.  It is important for you to complete the blood thinner medication as prescribed by your surgeon.  Continue your approved medications as instructed at time of discharge. ° °PRECAUTIONS °If you experience chest pain or shortness of breath - call 911 immediately for transfer to the hospital emergency department.  °If you develop a fever greater that 101 F,  purulent drainage from wound, increased redness or drainage from wound, foul odor from the wound/dressing, or calf pain - CONTACT YOUR SURGEON.   °                                                °FOLLOW-UP APPOINTMENTS °Make sure you keep all of your appointments after your operation with your surgeon and caregivers. You should call the office at the above phone number and make an appointment for approximately two weeks after the date of your surgery or on the date instructed by your surgeon outlined in the "After Visit Summary". ° °RANGE OF MOTION AND STRENGTHENING EXERCISES  °These exercises are designed to help you keep full movement of your hip joint. Follow your caregiver's or physical therapist's instructions. Perform all exercises about fifteen times, three times per day or as directed. Exercise both hips, even if you   have had only one joint replacement. These exercises can be done on a training (exercise) mat, on the floor, on a table or on a bed. Use whatever works the best and is most comfortable for you. Use music or television while you are exercising so that the exercises are a pleasant break in your day. This will make your life better with the exercises acting as a break in routine you can look forward to.  Lying on your back, slowly slide your foot toward your buttocks, raising your knee up off the floor. Then slowly slide your foot back down until your leg is straight again.  Lying on your back spread your legs as far apart as you can without causing discomfort.  Lying on your side, raise your upper leg and foot straight up from the floor as far as is comfortable. Slowly lower the leg and repeat.  Lying on your back, tighten up the muscle in the front of your thigh (quadriceps muscles). You can do this by keeping your leg straight and trying to raise your heel off the floor. This helps strengthen the largest muscle supporting your knee.  Lying on your back, tighten up the muscles of your  buttocks both with the legs straight and with the knee bent at a comfortable angle while keeping your heel on the floor.   IF YOU ARE TRANSFERRED TO A SKILLED REHAB FACILITY If the patient is transferred to a skilled rehab facility following release from the hospital, a list of the current medications will be sent to the facility for the patient to continue.  When discharged from the skilled rehab facility, please have the facility set up the patient's Home Health Physical Therapy prior to being released. Also, the skilled facility will be responsible for providing the patient with their medications at time of release from the facility to include their pain medication, the muscle relaxants, and their blood thinner medication. If the patient is still at the rehab facility at time of the two week follow up appointment, the skilled rehab facility will also need to assist the patient in arranging follow up appointment in our office and any transportation needs.  MAKE SURE YOU:  Understand these instructions.  Get help right away if you are not doing well or get worse.    Pick up stool softner and laxative for home use following surgery while on pain medications. Do not submerge incision under water. Please use good hand washing techniques while changing dressing each day. May shower starting three days after surgery. Please use a clean towel to pat the incision dry following showers. Continue to use ice for pain and swelling after surgery. Do not use any lotions or creams on the incision until instructed by your surgeon.  Take Xarelto for two and a half more weeks, then discontinue Xarelto. Once the patient has completed the blood thinner regimen, then take a Baby 81 mg Aspirin daily for three more weeks.    Information on my medicine - XARELTO (Rivaroxaban)  This medication education was reviewed with me or my healthcare representative as part of my discharge preparation.  The pharmacist that  spoke with me during my hospital stay was:  Otho Bellows, [facility]  Why was Xarelto prescribed for you? Xarelto was prescribed for you to reduce the risk of blood clots forming after orthopedic surgery. The medical term for these abnormal blood clots is venous thromboembolism (VTE).  What do you need to know about xarelto ? Take your  Xarelto ONCE DAILY at the same time every day. You may take it either with or without food.  If you have difficulty swallowing the tablet whole, you may crush it and mix in applesauce just prior to taking your dose.  Take Xarelto exactly as prescribed by your doctor and DO NOT stop taking Xarelto without talking to the doctor who prescribed the medication.  Stopping without other VTE prevention medication to take the place of Xarelto may increase your risk of developing a clot.  After discharge, you should have regular check-up appointments with your healthcare provider that is prescribing your Xarelto.    What do you do if you miss a dose? If you miss a dose, take it as soon as you remember on the same day then continue your regularly scheduled once daily regimen the next day. Do not take two doses of Xarelto on the same day.   Important Safety Information A possible side effect of Xarelto is bleeding. You should call your healthcare provider right away if you experience any of the following: ? Bleeding from an injury or your nose that does not stop. ? Unusual colored urine (red or dark brown) or unusual colored stools (red or black). ? Unusual bruising for unknown reasons. ? A serious fall or if you hit your head (even if there is no bleeding).  Some medicines may interact with Xarelto and might increase your risk of bleeding while on Xarelto. To help avoid this, consult your healthcare provider or pharmacist prior to using any new prescription or non-prescription medications, including herbals, vitamins, non-steroidal anti-inflammatory drugs (NSAIDs)  and supplements.  - Discussed holding Naproxen while on Xarelto, can use Tylenol, but must keep track of how much Tylenol used per day. No more than 4gm of Tylenol per day.  This website has more information on Xarelto: [email or web address].

## 2015-11-11 NOTE — Evaluation (Signed)
Physical Therapy Evaluation Patient Details Name: Victoria Owens MRN: [id number] DOB: 10/31/1945 Today's Date: 11/11/2015   History of Present Illness  s/p R DA THA  Clinical Impression  The patient  Will benefit from PT while in acute care. Ambulated x 200' first time. Plans DC home with family in and out.    Follow Up Recommendations Home health PT;Supervision - Intermittent    Equipment Recommendations  Rolling walker with 5" wheels    Recommendations for Other Services       Precautions / Restrictions Precautions Precautions: Fall Restrictions Weight Bearing Restrictions: No Other Position/Activity Restrictions: WBAT      Mobility  Bed Mobility               General bed mobility comments: in recliner  Transfers Overall transfer level: Needs assistance Equipment used: Rolling walker (2 wheeled) Transfers: Sit to/from Stand Sit to Stand: Min guard Stand pivot transfers: Min guard       General transfer comment: for safety; cues for UE placement  Ambulation/Gait Ambulation/Gait assistance: Min guard Ambulation Distance (Feet): 200 Feet Assistive device: Rolling walker (2 wheeled) Gait Pattern/deviations: Step-through pattern   Gait velocity interpretation: at or above normal speed for age/gender General Gait Details: cues for sequence  Stairs            Wheelchair Mobility    Modified Rankin (Stroke Patients Only)       Balance                                             Pertinent Vitals/Pain Pain Assessment: Faces Faces Pain Scale: Hurts a little bit Pain Location: R hip Pain Descriptors / Indicators: Sore Pain Intervention(s): Limited activity within patient's tolerance    Home Living Family/patient expects to be discharged to:: Private residence Living Arrangements: Alone Available Help at Discharge: Family (mother and brother in same building) Type of Home: Apartment (senior)         Home Equipment:  Grab bars - toilet;Grab bars - tub/shower;Bedside commode      Prior Function Level of Independence: Independent               Hand Dominance        Extremity/Trunk Assessment   Upper Extremity Assessment: Defer to OT evaluation           Lower Extremity Assessment: RLE deficits/detail RLE Deficits / Details: advances the  leg well, not antalgic    Cervical / Trunk Assessment: Normal  Communication   Communication: No difficulties (word finding difficulty)  Cognition Arousal/Alertness: Awake/alert Behavior During Therapy: WFL for tasks assessed/performed Overall Cognitive Status: No family/caregiver present to determine baseline cognitive functioning Area of Impairment: Memory               General Comments: Pt had difficulty adjusting chair after she was shown; word finding difficulties and cues for safety as she attempted to stand without RW once    General Comments      Exercises Total Joint Exercises Ankle Circles/Pumps: AROM;Both Short Arc Quad: AROM;Right;10 reps Heel Slides: AAROM;Right;10 reps Hip ABduction/ADduction: AAROM;Right;10 reps      Assessment/Plan    PT Assessment Patient needs continued PT services  PT Diagnosis Difficulty walking   PT Problem List Decreased strength;Decreased range of motion;Decreased activity tolerance;Decreased mobility;Decreased knowledge of precautions;Decreased safety awareness;Decreased knowledge of use of DME;Pain  PT Treatment  Interventions DME instruction;Gait training;Functional mobility training;Therapeutic activities;Therapeutic exercise;Patient/family education   PT Goals (Current goals can be found in the Care Plan section) Acute Rehab PT Goals Patient Stated Goal: home PT Goal Formulation: With patient Time For Goal Achievement: 11/14/15 Potential to Achieve Goals: Good    Frequency 7X/week   Barriers to discharge        Co-evaluation               End of Session   Activity  Tolerance: Patient tolerated treatment well Patient left: in chair;with call bell/phone within reach;with chair alarm set Nurse Communication: Mobility status         Time: 1610-9604 PT Time Calculation (min) (ACUTE ONLY): 35 min   Charges:   PT Evaluation $PT Eval Low Complexity: 1 Procedure PT Treatments $Gait Training: 8-22 mins   PT G Codes:        Rada Hay 11/11/2015, 10:34 AM

## 2015-11-11 NOTE — Progress Notes (Signed)
   Subjective: 1 Day Post-Op Procedure(s) (LRB): RIGHT TOTAL HIP ARTHROPLASTY ANTERIOR APPROACH (Right) Patient reports pain as mild.   Patient seen in rounds with Dr. Lequita HaltAluisio. Patient is well, but has had some minor complaints of pain in the knee, requiring pain medications We will start therapy today.  Plan is to go Home after hospital stay.  Objective: Vital signs in last 24 hours: Temp:  [97.4 F (36.3 C)-99 F (37.2 C)] 97.9 F (36.6 C) (04/11 0701) Pulse Rate:  [55-79] 59 (04/11 0701) Resp:  [13-24] 18 (04/11 0701) BP: (124-159)/(54-72) 138/54 mmHg (04/11 0701) SpO2:  [100 %] 100 % (04/11 0701) Weight:  [58.174 kg (128 lb 4 oz)] 58.174 kg (128 lb 4 oz) (04/10 1247)  Intake/Output from previous day:  Intake/Output Summary (Last 24 hours) at 11/11/15 0913 Last data filed at 11/11/15 0745  Gross per 24 hour  Intake   2000 ml  Output   2985 ml  Net   -985 ml    Intake/Output this shift: Total I/O In: -  Out: 400 [Urine:400]  Labs:  Recent Labs  11/11/15 0359  HGB 11.1*    Recent Labs  11/11/15 0359  WBC 9.0  RBC 3.70*  HCT 32.6*  PLT 223    Recent Labs  11/11/15 0359  NA 145  K 4.1  CL 110  CO2 27  BUN 15  CREATININE 0.74  GLUCOSE 139*  CALCIUM 8.9   No results for input(s): LABPT, INR in the last 72 hours.  EXAM General - Patient is Alert, Appropriate and Oriented Extremity - Neurovascular intact Sensation intact distally Dorsiflexion/Plantar flexion intact Dressing - dressing C/D/I Motor Function - intact, moving foot and toes well on exam.  Hemovac pulled without difficulty.  Past Medical History  Diagnosis Date  . Family history of adverse reaction to anesthesia     awakens slowly from anesthesia  . Hypertension   . Arthritis     Osteoarthritis -hips  . Edema extremities     lower extremities flutuates- decreases if elevated.    Assessment/Plan: 1 Day Post-Op Procedure(s) (LRB): RIGHT TOTAL HIP ARTHROPLASTY ANTERIOR  APPROACH (Right) Principal Problem:   OA (osteoarthritis) of hip  Estimated body mass index is 22.72 kg/(m^2) as calculated from the following:   Height as of this encounter: 5\' 3"  (1.6 m).   Weight as of this encounter: 58.174 kg (128 lb 4 oz). Advance diet Up with therapy Plan for discharge tomorrow  DVT Prophylaxis - Xarelto Weight Bearing As Tolerated right Leg Hemovac Pulled Begin Therapy  Avel Peacerew Ramiz Turpin, PA-C Orthopaedic Surgery 11/11/2015, 9:13 AM

## 2015-11-11 NOTE — Care Management Note (Signed)
Case Management Note  Patient Details  Name: Victoria Owens MRN: [id number] Date of Birth: 03-19-1946  Subjective/Objective:                  Right total hip arthroplasty, anterior approach.  Action/Plan: discharge planning Expected Discharge Date:                  Expected Discharge Plan:  [place]  In-House Referral:     Discharge planning Services  CM Consult  Post Acute Care Choice:  Home Health Choice offered to:  Patient  DME Arranged:  Walker rolling DME Agency:  [place]:  PT [place]:  [place]  Status of Service:  Completed, signed off  Medicare Important Message Given:    Date Medicare IM Given:    Medicare IM give by:    Date Additional Medicare IM Given:    Additional Medicare Important Message give by:     If discussed at [place] of Stay Meetings, dates discussed:    Additional Comments: Cm met with pt in room to offer choice of home health agency.  Pt chooses [facility] to render HHPT.  Referral called to [employer], Tim.  CM called [facility] DME rep, Lecretia to please deliver the rolling walker to room prior to discharge.  NO other CM needs were communicated. Dellie Catholic, RN 11/11/2015, 2:24 PM

## 2015-11-11 NOTE — Evaluation (Signed)
Occupational Therapy Evaluation Patient Details Name: Victoria Owens MRN: [id number] DOB: 09/02/1945 Today's Date: 11/11/2015    History of Present Illness s/p R DA THA   Clinical Impression   This 70 year old female was admitted for the above surgery. She will benefit from skilled OT to increase safety and independence with adls and bathroom transfers.  Goals in acute are for supervision level.  Pt currently needs up to mod A for LB dressing    Follow Up Recommendations  Supervision/Assistance - 24 hour    Equipment Recommendations  None recommended by OT    Recommendations for Other Services       Precautions / Restrictions Precautions Precautions: Fall Restrictions Weight Bearing Restrictions: No Other Position/Activity Restrictions: WBAT      Mobility Bed Mobility               General bed mobility comments: supervision  Transfers Overall transfer level: Needs assistance Equipment used: Rolling walker (2 wheeled) Transfers: Sit to/from UGI CorporationStand;Stand Pivot Transfers Sit to Stand: Min guard Stand pivot transfers: Min guard       General transfer comment: for safety; cues for UE placement    Balance                                            ADL Overall ADL's : Needs assistance/impaired     Grooming: Oral care;Set up;Sitting   Upper Body Bathing: Set up;Sitting   Lower Body Bathing: Minimal assistance;Sit to/from stand   Upper Body Dressing : Set up;Sitting   Lower Body Dressing: Moderate assistance;Sit to/from stand   Toilet Transfer: Min guard;Stand-pivot (RW)             General ADL Comments: performed ADL and educated on AE but did not use this session.  Pt c/o lightheadedness when standing.  sitting BP 145/61.       Vision     Perception     Praxis      Pertinent Vitals/Pain Pain Assessment: Faces Faces Pain Scale: Hurts a little bit Pain Location: R hip Pain Descriptors / Indicators: Sore Pain  Intervention(s): Limited activity within patient's tolerance;Monitored during session;Premedicated before session;Repositioned (removed ice; pt cold did not want it replaced)     Hand Dominance     Extremity/Trunk Assessment Upper Extremity Assessment Upper Extremity Assessment: Overall WFL for tasks assessed           Communication Communication Communication: No difficulties (word finding difficulty)   Cognition Arousal/Alertness: Awake/alert Behavior During Therapy: WFL for tasks assessed/performed Overall Cognitive Status: No family/caregiver present to determine baseline cognitive functioning Area of Impairment: Memory               General Comments: Pt had difficulty adjusting chair after she was shown; word finding difficulties and cues for safety as she attempted to stand without RW once   General Comments       Exercises       Shoulder Instructions      Home Living Family/patient expects to be discharged to:: Private residence Living Arrangements: Alone Available Help at Discharge: Family (mother and brother in same building) Type of Home: Apartment (senior)             Bathroom Shower/Tub: Walk-in Soil scientistshower   Bathroom Toilet: Handicapped height     Home Equipment: Grab bars - toilet;Grab bars - tub/shower;Bedside commode  Prior Functioning/Environment Level of Independence: Independent             OT Diagnosis: Generalized weakness   OT Problem List: Decreased activity tolerance;Decreased knowledge of use of DME or AE;Pain;Decreased safety awareness;Decreased cognition   OT Treatment/Interventions: Self-care/ADL training;DME and/or AE instruction;Patient/family education    OT Goals(Current goals can be found in the care plan section) Acute Rehab OT Goals Patient Stated Goal: home OT Goal Formulation: With patient Time For Goal Achievement: 11/18/15 Potential to Achieve Goals: Good ADL Goals Pt Will Perform Lower Body  Bathing: with supervision;with adaptive equipment;sit to/from stand Pt Will Perform Lower Body Dressing: with supervision;with adaptive equipment;sit to/from stand Pt Will Transfer to Toilet: with supervision;ambulating;bedside commode Pt Will Perform Tub/Shower Transfer: Shower transfer;ambulating;3 in 1;with supervision  OT Frequency: Min 2X/week   Barriers to D/C:            Co-evaluation              End of Session    Activity Tolerance: Treatment limited secondary to medical complications (Comment) (dizziness) Patient left: in chair;with call bell/phone within reach;with chair alarm set   Time: 0454-0981 OT Time Calculation (min): 28 min Charges:  OT General Charges $OT Visit: 1 Procedure OT Evaluation $OT Eval Low Complexity: 1 Procedure OT Treatments $Self Care/Home Management : 8-22 mins G-Codes:    Xara Paulding November 20, 2015, 9:49 AM  Marica Otter, OTR/L [phone] 2015/11/20

## 2015-11-12 LAB — BASIC METABOLIC PANEL
ANION GAP: 9 (ref 5–15)
BUN: 16 mg/dL (ref 6–20)
CALCIUM: 8.9 mg/dL (ref 8.9–10.3)
CHLORIDE: 101 mmol/L (ref 101–111)
CO2: 28 mmol/L (ref 22–32)
Creatinine, Ser: 0.68 mg/dL (ref 0.44–1.00)
GFR calc non Af Amer: >60 mL/min (ref >60)
Glucose, Bld: 117 mg/dL — ABNORMAL HIGH (ref 65–99)
Potassium: 3.6 mmol/L (ref 3.5–5.1)
Sodium: 138 mmol/L (ref 135–145)

## 2015-11-12 LAB — CBC
HCT: 31 % — ABNORMAL LOW (ref 36.0–46.0)
Hemoglobin: 10.5 g/dL — ABNORMAL LOW (ref 12.0–15.0)
MCH: 29.8 pg (ref 26.0–34.0)
MCHC: 33.9 g/dL (ref 30.0–36.0)
MCV: 88.1 fL (ref 78.0–100.0)
Platelets: 213 10*3/uL (ref 150–400)
RBC: 3.52 MIL/uL — AB (ref 3.87–5.11)
RDW: 13 % (ref 11.5–15.5)
WBC: 11 10*3/uL — ABNORMAL HIGH (ref 4.0–10.5)

## 2015-11-12 NOTE — Progress Notes (Signed)
Physical Therapy Treatment Patient Details Name: Victoria Owens MRN: [id number] DOB: 1946/01/03 Today's Date: 11/12/2015    History of Present Illness s/p R DA THA    PT Comments    No family present.  Patient attempted to call daughter, no answer. Will return when daughter is  present.  Follow Up Recommendations  Home health PT;Supervision/Assistance - 24 hour     Equipment Recommendations  Rolling walker with 5" wheels    Recommendations for Other Services       Precautions / Restrictions Precautions Precautions: Fall Precaution Comments: close supervision for safety Restrictions Weight Bearing Restrictions: No Other Position/Activity Restrictions: WBAT    Mobility  Bed Mobility           Sit to supine: Supervision   General bed mobility comments: practiced x 2  from high bed, verbal cues  Transfers Overall transfer level: Needs assistance Equipment used: Rolling walker (2 wheeled) Transfers: Sit to/from Stand Sit to Stand: Supervision         General transfer comment: cues for hand placement, cues for safety, not walk away from it  Ambulation/Gait Ambulation/Gait assistance: Supervision Ambulation Distance (Feet): 100 Feet Assistive device: Rolling walker (2 wheeled) Gait Pattern/deviations: Step-through pattern;Step-to pattern     General Gait Details: multimodal cues for sequence and safety   Stairs            Wheelchair Mobility    Modified Rankin (Stroke Patients Only)       Balance                                    Cognition Arousal/Alertness: Awake/alert Behavior During Therapy: WFL for tasks assessed/performed Overall Cognitive Status: No family/caregiver present to determine baseline cognitive functioning Area of Impairment: Memory;Following commands;Safety/judgement;Awareness;Problem solving     Memory: Decreased recall of precautions       Problem Solving: Difficulty sequencing;Requires verbal  cues General Comments: remains  somewhat confused, did call brother on phone.     Exercises Total Joint Exercises Ankle Circles/Pumps: AROM;Both;10 reps Short Arc Quad: AROM;Right;10 reps Heel Slides: AROM;Right;10 reps Hip ABduction/ADduction: AROM;Right;10 reps    General Comments        Pertinent Vitals/Pain Faces Pain Scale: Hurts a little bit Pain Location: r thigh Pain Descriptors / Indicators: Tightness Pain Intervention(s): Monitored during session    Home Living                      Prior Function            PT Goals (current goals can now be found in the care plan section) Progress towards PT goals: Progressing toward goals    Frequency  7X/week    PT Plan Current plan remains appropriate    Co-evaluation             End of Session   Activity Tolerance: Patient tolerated treatment well Patient left: in chair;with call bell/phone within reach;with chair alarm set     Time: [id number] PT Time Calculation (min) (ACUTE ONLY): 17 min  Charges:  $Gait Training: 8-22 mins $Therapeutic Exercise: 8-22 mins $Self Care/Home Management: 8-22                    G Codes:      Rada HayHill, Victoria Owens 11/12/2015, 1:00 PM Blanchard KelchKaren Tayden Owens PT [phone]

## 2015-11-12 NOTE — Progress Notes (Signed)
Occupational Therapy Treatment Patient Details Name: Victoria Owens MRN: [id number] DOB: [birth year] Today's Date: 11/12/2015    History of present illness s/p R DA THA   OT comments  Pt continues to need to cues for safety.  Able to don pants with supervision and AE  Follow Up Recommendations  Supervision/Assistance - 24 hour    Equipment Recommendations  None recommended by OT    Recommendations for Other Services      Precautions / Restrictions Precautions Precautions: Fall Precaution Comments: close supervision for safety Restrictions Weight Bearing Restrictions: No Other Position/Activity Restrictions: WBAT       Mobility Bed Mobility               General bed mobility comments: oob  Transfers   Equipment used: Rolling walker (2 wheeled) Transfers: Sit to/from Stand Sit to Stand: Supervision         General transfer comment: cues for hand placement    Balance                                   ADL                       Lower Body Dressing: Sit to/from stand;With adaptive equipment;Supervision/safety (pants only with cues for reacher)                 General ADL Comments: pt washed peri area and donned pants with reacher.  Pt has a stick with a "crook'" which she uses.  Pt needed cues for safety (to not stand without walker)and cues to use reacher (as she doesn't have this). She did not want to practice with the sock aide. She says she will have her mother or brother assist with this.  Will wait for family to review shower transfer      Vision                     Perception     Praxis      Cognition                        General Comments: needs cues for safety during adls/sit to stand    Extremity/Trunk Assessment               Exercises     Shoulder Instructions       General Comments      Pertinent Vitals/ Pain       Faces Pain Scale: Hurts a little bit Pain Location: R  hip Pain Descriptors / Indicators: Sore Pain Intervention(s): Limited activity within patient's tolerance;Monitored during session;Premedicated before session;Repositioned;Ice applied  Home Living                                          Prior Functioning/Environment              Frequency Min 2X/week     Progress Toward Goals  OT Goals(current goals can now be found in the care plan section)  Progress towards OT goals: Progressing toward goals     Plan      Co-evaluation                 End of Session     Activity  Tolerance Patient tolerated treatment well   Patient Left in chair;with call bell/phone within reach;with chair alarm set   Nurse Communication          Time: [id number] OT Time Calculation (min): 23 min  Charges: OT General Charges $OT Visit: 1 Procedure OT Treatments $Self Care/Home Management : 23-37 mins  Victoria Owens 11/12/2015, 10:12 AM Victoria Owens, OTR/L [phone] 11/12/2015

## 2015-11-12 NOTE — Progress Notes (Signed)
Physical Therapy Treatment Patient Details Name: Victoria Owens Holzer MRN: [id number] DOB: [birth year] Today's Date: 11/12/2015    History of Present Illness s/p R DA THA    PT Comments    No family has been present to discuss patient's confusion and  Progress and who will be staying with patient. Patient is improved in MS but continues to demonstrate decreased safety like parks RW by wall and starts to walk away, turns around inside the RW when at the toilet, places RW and then steps outside of it. Will speak with daughter when she arrives to take patient home.RN aware of decreased safety.  Follow Up Recommendations  Home health PT;Supervision/Assistance - 24 hour     Equipment Recommendations  Rolling walker with 5" wheels    Recommendations for Other Services       Precautions / Restrictions Precautions Precautions: Fall Precaution Comments: close supervision for safety Restrictions Weight Bearing Restrictions: No Other Position/Activity Restrictions: WBAT    Mobility  Bed Mobility           Sit to supine: Supervision   General bed mobility comments: practiced x 2  from high bed, verbal cues  Transfers Overall transfer level: Needs assistance Equipment used: Rolling walker (2 wheeled) Transfers: Sit to/from Stand Sit to Stand: Supervision         General transfer comment: cues for hand placement, cues for safety, not walk away from it  Ambulation/Gait Ambulation/Gait assistance: Supervision Ambulation Distance (Feet): 100 Feet Assistive device: Rolling walker (2 wheeled) Gait Pattern/deviations: Step-through pattern;Step-to pattern     General Gait Details: multimodal cues for sequqnce and safety   Stairs            Wheelchair Mobility    Modified Rankin (Stroke Patients Only)       Balance                                    Cognition Arousal/Alertness: Awake/alert Behavior During Therapy: WFL for tasks  assessed/performed Overall Cognitive Status: No family/caregiver present to determine baseline cognitive functioning Area of Impairment: Memory;Following commands;Safety/judgement;Awareness;Problem solving     Memory: Decreased recall of precautions       Problem Solving: Difficulty sequencing;Requires verbal cues General Comments: needs cues for safety during adls/sit to stand, walks away from the RW, turns inside  it    Exercises Total Joint Exercises Ankle Circles/Pumps: AROM;Both;10 reps Short Arc Quad: AROM;Right;10 reps Heel Slides: AROM;Right;10 reps Hip ABduction/ADduction: AROM;Right;10 reps    General Comments        Pertinent Vitals/Pain Faces Pain Scale: Hurts a little bit Pain Location: R thigh Pain Descriptors / Indicators: Tender Pain Intervention(s): Limited activity within patient's tolerance;Monitored during session;Ice applied    Home Living                      Prior Function            PT Goals (current goals can now be found in the care plan section) Progress towards PT goals: Progressing toward goals    Frequency  7X/week    PT Plan Current plan remains appropriate    Co-evaluation             End of Session   Activity Tolerance: Patient tolerated treatment well Patient left: in chair;with call bell/phone within reach;with chair alarm set     Time: 0825-0920 PT Time Calculation (min) (ACUTE ONLY):  55 min  Charges:  $Gait Training: 8-22 mins $Therapeutic Exercise: 8-22 mins $Self Care/Home Management: 04/03/23                    G Codes:      Rada Hay 11/12/2015, 12:55 PM

## 2015-11-12 NOTE — Progress Notes (Signed)
   Subjective: 2 Days Post-Op Procedure(s) (LRB): RIGHT TOTAL HIP ARTHROPLASTY ANTERIOR APPROACH (Right) Patient reports pain as mild.   Patient seen in rounds with Dr. Lequita HaltAluisio. Patient is well, and has had no acute complaints or problems Patient is ready to go home.  Objective: Vital signs in last 24 hours: Temp:  [97.5 F (36.4 C)-98.2 F (36.8 C)] 97.5 F (36.4 C) (04/12 0542) Pulse Rate:  [59-78] 78 (04/12 0542) Resp:  [17-18] 17 (04/12 0542) BP: (127-144)/(50-63) 142/50 mmHg (04/12 0542) SpO2:  [99 %-100 %] 100 % (04/12 0542)  Intake/Output from previous day:  Intake/Output Summary (Last 24 hours) at 11/12/15 0654 Last data filed at 11/12/15 0543  Gross per 24 hour  Intake 1903.58 ml  Output    900 ml  Net 1003.58 ml    Intake/Output this shift: Total I/O In: 480 [P.O.:480] Out: -   Labs:  Recent Labs  11/11/15 0359 11/12/15 0400  HGB 11.1* 10.5*    Recent Labs  11/11/15 0359 11/12/15 0400  WBC 9.0 11.0*  RBC 3.70* 3.52*  HCT 32.6* 31.0*  PLT 223 213    Recent Labs  11/11/15 0359 11/12/15 0400  NA 145 138  K 4.1 3.6  CL 110 101  CO2 27 28  BUN 15 16  CREATININE 0.74 0.68  GLUCOSE 139* 117*  CALCIUM 8.9 8.9   No results for input(s): LABPT, INR in the last 72 hours.  EXAM: General - Patient is Alert and Appropriate Extremity - Neurovascular intact Sensation intact distally Dorsiflexion/Plantar flexion intact Incision - clean, dry, no drainage Motor Function - intact, moving foot and toes well on exam.   Assessment/Plan: 2 Days Post-Op Procedure(s) (LRB): RIGHT TOTAL HIP ARTHROPLASTY ANTERIOR APPROACH (Right) Procedure(s) (LRB): RIGHT TOTAL HIP ARTHROPLASTY ANTERIOR APPROACH (Right) Past Medical History  Diagnosis Date  . Family history of adverse reaction to anesthesia     awakens slowly from anesthesia  . Hypertension   . Arthritis     Osteoarthritis -hips  . Edema extremities     lower extremities flutuates- decreases if  elevated.   Principal Problem:   OA (osteoarthritis) of hip  Estimated body mass index is 22.72 kg/(m^2) as calculated from the following:   Height as of this encounter: 5\' 3"  (1.6 m).   Weight as of this encounter: 58.174 kg (128 lb 4 oz). Up with therapy Discharge home with home health Diet - regular diet Follow up - in 2 weeks Activity - WBAT Disposition - Home Condition Upon Discharge - Good D/C Meds - See DC Summary DVT Prophylaxis - Xarelto  Avel Peacerew Katheryn Culliton, PA-C Orthopaedic Surgery 11/12/2015, 6:54 AM

## 2015-11-12 NOTE — Progress Notes (Signed)
Physical Therapy Treatment Patient Details Name: Victoria Owens Overall MRN: [id number] DOB: 11-17-1945 Today's Date: 11/12/2015    History of Present Illness s/p R DA THA    PT Comments    The patient's son arrived, reviewed safety with RW with him and patient and concerns for patient's forgetting to use the RW. Recommend that patient has 24/7 caregivers..Son acknowledges this. Plans  For DC today.  Follow Up Recommendations  Home health PT;Supervision/Assistance - 24 hour     Equipment Recommendations  Rolling walker with 5" wheels    Recommendations for Other Services       Precautions / Restrictions Precautions Precautions: Fall Precaution Comments: close supervision for safety    Mobility  Bed Mobility           Sit to supine: Supervision   General bed mobility comments: practiced x 2  from high bed, verbal cues  Transfers Overall transfer level: Needs assistance Equipment used: Rolling walker (2 wheeled) Transfers: Sit to/from Stand Sit to Stand: Supervision         General transfer comment: patient up standing at the recliner, SRN with patient. Assisted to sit down.  Ambulation/Gait Ambulation/Gait assistance: Supervision Ambulation Distance (Feet): 100 Feet Assistive device: Rolling walker (2 wheeled) Gait Pattern/deviations: Step-through pattern;Step-to pattern     General Gait Details: multimodal cues for sequence and safety   Stairs            Wheelchair Mobility    Modified Rankin (Stroke Patients Only)       Balance                                    Cognition Arousal/Alertness: Awake/alert Behavior During Therapy: WFL for tasks assessed/performed Overall Cognitive Status: No family/caregiver present to determine baseline cognitive functioning Area of Impairment: Memory;Following commands;Safety/judgement;Awareness;Problem solving     Memory: Decreased recall of precautions       Problem Solving: Difficulty  sequencing;Requires verbal cues General Comments: remains  somewhat confused, did call brother on phone.     Exercises Total Joint Exercises Ankle Circles/Pumps: AROM;Both;10 reps Short Arc Quad: AROM;Right;10 reps Heel Slides: AROM;Right;10 reps Hip ABduction/ADduction: AROM;Right;10 reps    General Comments        Pertinent Vitals/Pain Faces Pain Scale: Hurts a little bit Pain Location: r thigh Pain Descriptors / Indicators: Tightness Pain Intervention(s): Monitored during session    Home Living                      Prior Function            PT Goals (current goals can now be found in the care plan section) Progress towards PT goals: Progressing toward goals    Frequency  7X/week    PT Plan Current plan remains appropriate    Co-evaluation             End of Session   Activity Tolerance: Patient tolerated treatment well Patient left: in chair;with call bell/phone within reach;with chair alarm set     Time: 1430-1443 PT Time Calculation (min) (ACUTE ONLY): 13 min  Charges:  $Gait Training: 8-22 mins $Therapeutic Exercise: 8-22 mins $Self Care/Home Management: 8-22                    G Codes:      Sharen HeckHill, Mekai Wilkinson Elizabeth Tauno Falotico PT [phone]  11/12/2015, 3:06 PM

## 2015-11-12 NOTE — Anesthesia Postprocedure Evaluation (Signed)
Anesthesia Post Note  Patient: Victoria Owens  Procedure(s) Performed: Procedure(s) (LRB): RIGHT TOTAL HIP ARTHROPLASTY ANTERIOR APPROACH (Right)  Patient location during evaluation: PACU Anesthesia Type: Spinal Level of consciousness: awake Pain management: satisfactory to patient Vital Signs Assessment: post-procedure vital signs reviewed and stable Respiratory status: spontaneous breathing Cardiovascular status: blood pressure returned to baseline Postop Assessment: no headache and spinal receding Anesthetic complications: no    Last Vitals:  Filed Vitals:   11/11/15 2051 11/12/15 0542  BP: 144/63 142/50  Pulse: 68 78  Temp: 36.5 C 36.4 C  Resp: 17 17    Last Pain:  Filed Vitals:   11/12/15 0553  PainSc: 4                  Deadra Diggins EDWARD

## 2015-11-12 NOTE — Progress Notes (Signed)
[facility]    [facility] the [facility]  If patient discharges after hours, please call [phone].   Renard HamperLecretia Williamson 11/12/2015, 12:16 PM

## 2015-11-12 NOTE — Progress Notes (Signed)
Occupational Therapy Treatment Patient Details Name: Victoria Owens MRN: [id number] DOB: Aug 04, 1945 Today's Date: 11/12/2015    History of present illness s/p R DA THA   OT comments  Family present for family education.  Re-enforced safety  Follow Up Recommendations  Supervision/Assistance - 24 hour    Equipment Recommendations  None recommended by OT    Recommendations for Other Services      Precautions / Restrictions Precautions Precautions: Fall Precaution Comments: close supervision for safety       Mobility Bed Mobility           Sit to supine: Supervision   General bed mobility comments: practiced x 2  from high bed, verbal cues  Transfers Overall transfer level: Needs assistance Equipment used: Rolling walker (2 wheeled) Transfers: Sit to/from Stand Sit to Stand: Supervision             Balance                                   ADL                                   Tub/ Shower Transfer: Min guard;Ambulation;Walk-in shower (for safety)     General ADL Comments: Son present.  Reviewed shower transfer, transferring to chair, safety cues needed for RW distance, using it at all times when up and keeping her body within constraints of this.  Reinforced need for assistance as pt is very independent natured. Recommended 24/7 assistance      Vision                     Perception     Praxis      Cognition   Behavior During Therapy: [facility]FL for tasks assessed/performed Overall Cognitive Status: No family/caregiver present to determine baseline cognitive functioning Area of Impairment: Memory;Following commands;Safety/judgement;Awareness;Problem solving     Memory: Decreased recall of precautions        Problem Solving: Difficulty sequencing;Requires verbal cues General Comments: continues to need cues for safety    Extremity/Trunk Assessment               Exercises    Shoulder Instructions        General Comments      Pertinent Vitals/ Pain       Faces Pain Scale: Hurts a little bit Pain Location: R thigh Pain Descriptors / Indicators: Sore Pain Intervention(s): Limited activity within patient's tolerance;Monitored during session;Repositioned  Home Living                                          Prior Functioning/Environment              Frequency       Progress Toward Goals  OT Goals(current goals can now be found in the care plan section)  Progress towards OT goals: Progressing toward goals     Plan      Co-evaluation                 End of Session     Activity Tolerance Patient tolerated treatment well   Patient Left in chair;with call bell/phone within reach;with chair alarm set;with nursing/sitter in room;with family/visitor present  Nurse Communication          Time: 1610-9604 OT Time Calculation (min): 9 min  Charges: OT General Charges $OT Visit: 1 Procedure OT Treatments $Self Care/Home Management : 8-22 mins  Wendy Hoback 11/12/2015, 3:14 PM Marica Otter, OTR/L [phone] 11/12/2015

## 2015-11-14 DIAGNOSIS — Z471 Aftercare following joint replacement surgery: Secondary | ICD-10-CM | POA: Diagnosis not present

## 2015-11-14 DIAGNOSIS — Z9181 History of falling: Secondary | ICD-10-CM | POA: Diagnosis not present

## 2015-11-14 DIAGNOSIS — Z96641 Presence of right artificial hip joint: Secondary | ICD-10-CM | POA: Diagnosis not present

## 2015-11-14 DIAGNOSIS — I10 Essential (primary) hypertension: Secondary | ICD-10-CM | POA: Diagnosis not present

## 2015-11-14 DIAGNOSIS — Z79891 Long term (current) use of opiate analgesic: Secondary | ICD-10-CM | POA: Diagnosis not present

## 2015-11-14 DIAGNOSIS — M1612 Unilateral primary osteoarthritis, left hip: Secondary | ICD-10-CM | POA: Diagnosis not present

## 2015-11-14 DIAGNOSIS — Z7901 Long term (current) use of anticoagulants: Secondary | ICD-10-CM | POA: Diagnosis not present

## 2015-11-18 DIAGNOSIS — Z96641 Presence of right artificial hip joint: Secondary | ICD-10-CM | POA: Diagnosis not present

## 2015-11-18 DIAGNOSIS — I10 Essential (primary) hypertension: Secondary | ICD-10-CM | POA: Diagnosis not present

## 2015-11-18 DIAGNOSIS — Z7901 Long term (current) use of anticoagulants: Secondary | ICD-10-CM | POA: Diagnosis not present

## 2015-11-18 DIAGNOSIS — Z9181 History of falling: Secondary | ICD-10-CM | POA: Diagnosis not present

## 2015-11-18 DIAGNOSIS — Z79891 Long term (current) use of opiate analgesic: Secondary | ICD-10-CM | POA: Diagnosis not present

## 2015-11-18 DIAGNOSIS — Z471 Aftercare following joint replacement surgery: Secondary | ICD-10-CM | POA: Diagnosis not present

## 2015-11-18 DIAGNOSIS — M1612 Unilateral primary osteoarthritis, left hip: Secondary | ICD-10-CM | POA: Diagnosis not present

## 2015-11-19 DIAGNOSIS — M1612 Unilateral primary osteoarthritis, left hip: Secondary | ICD-10-CM | POA: Diagnosis not present

## 2015-11-19 DIAGNOSIS — Z471 Aftercare following joint replacement surgery: Secondary | ICD-10-CM | POA: Diagnosis not present

## 2015-11-19 DIAGNOSIS — Z96641 Presence of right artificial hip joint: Secondary | ICD-10-CM | POA: Diagnosis not present

## 2015-11-19 DIAGNOSIS — Z9181 History of falling: Secondary | ICD-10-CM | POA: Diagnosis not present

## 2015-11-19 DIAGNOSIS — I10 Essential (primary) hypertension: Secondary | ICD-10-CM | POA: Diagnosis not present

## 2015-11-19 DIAGNOSIS — Z7901 Long term (current) use of anticoagulants: Secondary | ICD-10-CM | POA: Diagnosis not present

## 2015-11-19 DIAGNOSIS — Z79891 Long term (current) use of opiate analgesic: Secondary | ICD-10-CM | POA: Diagnosis not present

## 2015-11-20 DIAGNOSIS — Z471 Aftercare following joint replacement surgery: Secondary | ICD-10-CM | POA: Diagnosis not present

## 2015-11-20 DIAGNOSIS — Z7901 Long term (current) use of anticoagulants: Secondary | ICD-10-CM | POA: Diagnosis not present

## 2015-11-20 DIAGNOSIS — M1612 Unilateral primary osteoarthritis, left hip: Secondary | ICD-10-CM | POA: Diagnosis not present

## 2015-11-20 DIAGNOSIS — Z79891 Long term (current) use of opiate analgesic: Secondary | ICD-10-CM | POA: Diagnosis not present

## 2015-11-20 DIAGNOSIS — I10 Essential (primary) hypertension: Secondary | ICD-10-CM | POA: Diagnosis not present

## 2015-11-20 DIAGNOSIS — Z9181 History of falling: Secondary | ICD-10-CM | POA: Diagnosis not present

## 2015-11-20 DIAGNOSIS — Z96641 Presence of right artificial hip joint: Secondary | ICD-10-CM | POA: Diagnosis not present

## 2015-11-26 DIAGNOSIS — Z79891 Long term (current) use of opiate analgesic: Secondary | ICD-10-CM | POA: Diagnosis not present

## 2015-11-26 DIAGNOSIS — Z9181 History of falling: Secondary | ICD-10-CM | POA: Diagnosis not present

## 2015-11-26 DIAGNOSIS — M1612 Unilateral primary osteoarthritis, left hip: Secondary | ICD-10-CM | POA: Diagnosis not present

## 2015-11-26 DIAGNOSIS — Z7901 Long term (current) use of anticoagulants: Secondary | ICD-10-CM | POA: Diagnosis not present

## 2015-11-26 DIAGNOSIS — I10 Essential (primary) hypertension: Secondary | ICD-10-CM | POA: Diagnosis not present

## 2015-11-26 DIAGNOSIS — Z471 Aftercare following joint replacement surgery: Secondary | ICD-10-CM | POA: Diagnosis not present

## 2015-11-26 DIAGNOSIS — Z96641 Presence of right artificial hip joint: Secondary | ICD-10-CM | POA: Diagnosis not present

## 2015-12-18 DIAGNOSIS — M1611 Unilateral primary osteoarthritis, right hip: Secondary | ICD-10-CM | POA: Diagnosis not present

## 2015-12-18 DIAGNOSIS — Z96641 Presence of right artificial hip joint: Secondary | ICD-10-CM | POA: Diagnosis not present

## 2015-12-18 DIAGNOSIS — Z471 Aftercare following joint replacement surgery: Secondary | ICD-10-CM | POA: Diagnosis not present

## 2016-01-30 DIAGNOSIS — M1612 Unilateral primary osteoarthritis, left hip: Secondary | ICD-10-CM | POA: Diagnosis not present

## 2016-12-20 DIAGNOSIS — Z79899 Other long term (current) drug therapy: Secondary | ICD-10-CM | POA: Diagnosis not present

## 2016-12-20 DIAGNOSIS — Z6822 Body mass index (BMI) 22.0-22.9, adult: Secondary | ICD-10-CM | POA: Diagnosis not present

## 2016-12-20 DIAGNOSIS — Z0000 Encounter for general adult medical examination without abnormal findings: Secondary | ICD-10-CM | POA: Diagnosis not present

## 2016-12-20 DIAGNOSIS — I10 Essential (primary) hypertension: Secondary | ICD-10-CM | POA: Diagnosis not present

## 2017-02-08 DIAGNOSIS — I10 Essential (primary) hypertension: Secondary | ICD-10-CM | POA: Diagnosis not present

## 2017-02-08 DIAGNOSIS — E559 Vitamin D deficiency, unspecified: Secondary | ICD-10-CM | POA: Diagnosis not present

## 2017-02-08 DIAGNOSIS — E538 Deficiency of other specified B group vitamins: Secondary | ICD-10-CM | POA: Diagnosis not present

## 2017-02-08 DIAGNOSIS — N390 Urinary tract infection, site not specified: Secondary | ICD-10-CM | POA: Diagnosis not present

## 2017-02-09 DIAGNOSIS — M8580 Other specified disorders of bone density and structure, unspecified site: Secondary | ICD-10-CM | POA: Diagnosis not present

## 2017-02-09 DIAGNOSIS — I10 Essential (primary) hypertension: Secondary | ICD-10-CM | POA: Diagnosis not present

## 2017-02-09 DIAGNOSIS — E785 Hyperlipidemia, unspecified: Secondary | ICD-10-CM | POA: Diagnosis not present

## 2017-02-09 DIAGNOSIS — Z1211 Encounter for screening for malignant neoplasm of colon: Secondary | ICD-10-CM | POA: Diagnosis not present

## 2017-02-09 DIAGNOSIS — E538 Deficiency of other specified B group vitamins: Secondary | ICD-10-CM | POA: Diagnosis not present

## 2017-02-09 DIAGNOSIS — N182 Chronic kidney disease, stage 2 (mild): Secondary | ICD-10-CM | POA: Diagnosis not present

## 2017-02-09 DIAGNOSIS — Z0000 Encounter for general adult medical examination without abnormal findings: Secondary | ICD-10-CM | POA: Diagnosis not present

## 2017-02-09 DIAGNOSIS — N390 Urinary tract infection, site not specified: Secondary | ICD-10-CM | POA: Diagnosis not present

## 2017-03-03 DIAGNOSIS — Z1212 Encounter for screening for malignant neoplasm of rectum: Secondary | ICD-10-CM | POA: Diagnosis not present

## 2017-03-03 DIAGNOSIS — Z1211 Encounter for screening for malignant neoplasm of colon: Secondary | ICD-10-CM | POA: Diagnosis not present

## 2017-10-19 DIAGNOSIS — Z880 Allergy status to penicillin: Secondary | ICD-10-CM | POA: Diagnosis not present

## 2017-10-19 DIAGNOSIS — R269 Unspecified abnormalities of gait and mobility: Secondary | ICD-10-CM | POA: Diagnosis not present

## 2017-10-19 DIAGNOSIS — K08409 Partial loss of teeth, unspecified cause, unspecified class: Secondary | ICD-10-CM | POA: Diagnosis not present

## 2017-10-19 DIAGNOSIS — R32 Unspecified urinary incontinence: Secondary | ICD-10-CM | POA: Diagnosis not present

## 2017-10-19 DIAGNOSIS — Z833 Family history of diabetes mellitus: Secondary | ICD-10-CM | POA: Diagnosis not present

## 2017-10-19 DIAGNOSIS — Z96649 Presence of unspecified artificial hip joint: Secondary | ICD-10-CM | POA: Diagnosis not present

## 2017-10-19 DIAGNOSIS — Z8249 Family history of ischemic heart disease and other diseases of the circulatory system: Secondary | ICD-10-CM | POA: Diagnosis not present

## 2017-10-19 DIAGNOSIS — I10 Essential (primary) hypertension: Secondary | ICD-10-CM | POA: Diagnosis not present

## 2019-07-19 DIAGNOSIS — R69 Illness, unspecified: Secondary | ICD-10-CM | POA: Diagnosis not present

## 2020-10-20 ENCOUNTER — Emergency Department (HOSPITAL_COMMUNITY): Payer: Medicare HMO

## 2020-10-20 ENCOUNTER — Observation Stay (HOSPITAL_COMMUNITY)
Admission: EM | Admit: 2020-10-20 | Discharge: 2020-10-22 | Disposition: A | Discharge status: Home / Self Care | Payer: Medicare HMO | Attending: Internal Medicine | Admitting: Internal Medicine

## 2020-10-20 ENCOUNTER — Encounter (HOSPITAL_COMMUNITY): Payer: Self-pay

## 2020-10-20 DIAGNOSIS — W19XXXA Unspecified fall, initial encounter: Secondary | ICD-10-CM | POA: Insufficient documentation

## 2020-10-20 DIAGNOSIS — D649 Anemia, unspecified: Secondary | ICD-10-CM | POA: Insufficient documentation

## 2020-10-20 DIAGNOSIS — R7989 Other specified abnormal findings of blood chemistry: Secondary | ICD-10-CM

## 2020-10-20 DIAGNOSIS — Z043 Encounter for examination and observation following other accident: Secondary | ICD-10-CM | POA: Diagnosis not present

## 2020-10-20 DIAGNOSIS — R7401 Elevation of levels of liver transaminase levels: Secondary | ICD-10-CM | POA: Diagnosis not present

## 2020-10-20 DIAGNOSIS — I951 Orthostatic hypotension: Secondary | ICD-10-CM | POA: Insufficient documentation

## 2020-10-20 DIAGNOSIS — E876 Hypokalemia: Secondary | ICD-10-CM

## 2020-10-20 DIAGNOSIS — M6282 Rhabdomyolysis: Secondary | ICD-10-CM | POA: Diagnosis not present

## 2020-10-20 DIAGNOSIS — I10 Essential (primary) hypertension: Secondary | ICD-10-CM | POA: Diagnosis not present

## 2020-10-20 DIAGNOSIS — Z20822 Contact with and (suspected) exposure to covid-19: Secondary | ICD-10-CM | POA: Diagnosis not present

## 2020-10-20 DIAGNOSIS — S7002XA Contusion of left hip, initial encounter: Principal | ICD-10-CM | POA: Insufficient documentation

## 2020-10-20 DIAGNOSIS — M25512 Pain in left shoulder: Secondary | ICD-10-CM | POA: Insufficient documentation

## 2020-10-20 DIAGNOSIS — M25552 Pain in left hip: Secondary | ICD-10-CM | POA: Diagnosis not present

## 2020-10-20 DIAGNOSIS — N179 Acute kidney failure, unspecified: Secondary | ICD-10-CM | POA: Diagnosis present

## 2020-10-20 DIAGNOSIS — Y92009 Unspecified place in unspecified non-institutional (private) residence as the place of occurrence of the external cause: Secondary | ICD-10-CM | POA: Insufficient documentation

## 2020-10-20 DIAGNOSIS — R9431 Abnormal electrocardiogram [ECG] [EKG]: Secondary | ICD-10-CM | POA: Diagnosis not present

## 2020-10-20 DIAGNOSIS — R748 Abnormal levels of other serum enzymes: Secondary | ICD-10-CM

## 2020-10-20 DIAGNOSIS — F0390 Unspecified dementia without behavioral disturbance: Secondary | ICD-10-CM | POA: Diagnosis not present

## 2020-10-20 DIAGNOSIS — S79912A Unspecified injury of left hip, initial encounter: Secondary | ICD-10-CM | POA: Diagnosis present

## 2020-10-20 DIAGNOSIS — Z96641 Presence of right artificial hip joint: Secondary | ICD-10-CM | POA: Insufficient documentation

## 2020-10-20 LAB — COMPREHENSIVE METABOLIC PANEL
ALT: 43 U/L (ref 0–44)
AST: 110 U/L — ABNORMAL HIGH (ref 15–41)
Albumin: 3.2 g/dL — ABNORMAL LOW (ref 3.5–5.0)
Alkaline Phosphatase: 65 U/L (ref 38–126)
Anion gap: 12 (ref 5–15)
BUN: 27 mg/dL — ABNORMAL HIGH (ref 8–23)
CO2: 20 mmol/L — ABNORMAL LOW (ref 22–32)
Calcium: 9 mg/dL (ref 8.9–10.3)
Chloride: 103 mmol/L (ref 98–111)
Creatinine, Ser: 1.2 mg/dL — ABNORMAL HIGH (ref 0.44–1.00)
GFR, Estimated: 47 mL/min — ABNORMAL LOW (ref >60)
Glucose, Bld: 189 mg/dL — ABNORMAL HIGH (ref 70–99)
Potassium: 3.1 mmol/L — ABNORMAL LOW (ref 3.5–5.1)
Sodium: 135 mmol/L (ref 135–145)
Total Bilirubin: 0.7 mg/dL (ref 0.3–1.2)
Total Protein: 6.7 g/dL (ref 6.5–8.1)

## 2020-10-20 LAB — CBC WITH DIFFERENTIAL/PLATELET
Abs Immature Granulocytes: 0.01 10*3/uL (ref 0.00–0.07)
Basophils Absolute: 0 10*3/uL (ref 0.0–0.1)
Basophils Relative: 0 %
Eosinophils Absolute: 0 10*3/uL (ref 0.0–0.5)
Eosinophils Relative: 0 %
HCT: 45 % (ref 36.0–46.0)
Hemoglobin: 14.8 g/dL (ref 12.0–15.0)
Immature Granulocytes: 0 %
Lymphocytes Relative: 8 %
Lymphs Abs: 0.5 10*3/uL — ABNORMAL LOW (ref 0.7–4.0)
MCH: 29.8 pg (ref 26.0–34.0)
MCHC: 32.9 g/dL (ref 30.0–36.0)
MCV: 90.5 fL (ref 80.0–100.0)
Monocytes Absolute: 0.4 10*3/uL (ref 0.1–1.0)
Monocytes Relative: 7 %
Neutro Abs: 4.6 10*3/uL (ref 1.7–7.7)
Neutrophils Relative %: 85 %
Platelets: 232 10*3/uL (ref 150–400)
RBC: 4.97 MIL/uL (ref 3.87–5.11)
RDW: 12.7 % (ref 11.5–15.5)
WBC: 5.5 10*3/uL (ref 4.0–10.5)
nRBC: 0 % (ref 0.0–0.2)

## 2020-10-20 NOTE — ED Triage Notes (Signed)
Pt family reports found pt laying down on the floor in urine, unknown down time, Unknown LOC and unwitnessed. Pt family states  lives in an assistive living and complaining of left arm pain. Pt family states pt have no been diagnose with Alzheimer but believe she has it and currently looking for an PCP

## 2020-10-21 ENCOUNTER — Emergency Department (HOSPITAL_COMMUNITY): Payer: Medicare HMO

## 2020-10-21 DIAGNOSIS — N179 Acute kidney failure, unspecified: Secondary | ICD-10-CM | POA: Diagnosis present

## 2020-10-21 DIAGNOSIS — Z043 Encounter for examination and observation following other accident: Secondary | ICD-10-CM | POA: Diagnosis not present

## 2020-10-21 DIAGNOSIS — M25552 Pain in left hip: Secondary | ICD-10-CM | POA: Diagnosis not present

## 2020-10-21 LAB — CK: Total CK: 3430 U/L — ABNORMAL HIGH (ref 38–234)

## 2020-10-21 LAB — SARS CORONAVIRUS 2 (TAT 6-24 HRS): SARS Coronavirus 2: NEGATIVE

## 2020-10-21 MED ORDER — ACETAMINOPHEN 325 MG PO TABS: 650.0000 mg | ORAL_TABLET | Freq: Four times a day (QID) | ORAL | Status: DC | PRN

## 2020-10-21 MED ORDER — LACTATED RINGERS IV BOLUS
1000.0000 mL | Freq: Once | INTRAVENOUS | Status: AC
  Administered 2020-10-21: 1000 mL via INTRAVENOUS

## 2020-10-21 MED ORDER — HEPARIN SODIUM (PORCINE) 5000 UNIT/ML IJ SOLN
5000.0000 [IU] | Freq: Three times a day (TID) | INTRAMUSCULAR | Status: DC
  Administered 2020-10-21 – 2020-10-22 (×4): 5000 [IU] via SUBCUTANEOUS
  Filled 2020-10-21 (×4): qty 1

## 2020-10-21 MED ORDER — ACETAMINOPHEN 650 MG RE SUPP: 650.0000 mg | Freq: Four times a day (QID) | RECTAL | Status: DC | PRN

## 2020-10-21 MED ORDER — POTASSIUM CHLORIDE CRYS ER 20 MEQ PO TBCR
40.0000 meq | EXTENDED_RELEASE_TABLET | Freq: Two times a day (BID) | ORAL | Status: AC
  Administered 2020-10-21 (×2): 40 meq via ORAL
  Filled 2020-10-21 (×2): qty 2

## 2020-10-21 MED ORDER — LACTATED RINGERS IV SOLN: INTRAVENOUS | Status: AC

## 2020-10-21 NOTE — Hospital Course (Signed)
Admitted 10/20/2020  Allergies: Penicillins Pertinent Hx: HTN, bilateral hip osteoarthritis status post right hip replacement  75 y.o. female p/w mechanical fall  *AKI: Creatinine elevated at 1.2 on admission, last recorded Cr 0.6, CK 3400.  Patient found after mechanical fall and unknown time down.  Likely secondary to CK level.  Given 1 L normal saline.  Repeating CK and monitoring kidney function.  *Fall: Patient described as a mechanical fall, no prodromal symptoms, had urinary incontinence however this appears to be chronic.  Left hip x-ray showed degenerative changes, needs to have hip replacement in future.  Getting PT/OT.  Consults: None  Meds: Tylenol VTE ppx: Heparin IVF: LR Diet: HH

## 2020-10-21 NOTE — ED Notes (Signed)
Patient transported to CT via stretcher in stable condition 

## 2020-10-21 NOTE — ED Provider Notes (Signed)
°  Physical Exam  BP 121/90 (BP Location: Right Arm)    Pulse 70    Temp 98 F (36.7 C) (Oral)    Resp 16    SpO2 99%   Physical Exam  ED Course/Procedures     Procedures  MDM  Received patient in signout.  Left hip pain after fall.  Had been on floor for unknown amount of time.  Creatinine mildly elevated the last one we have was 5 years ago.  Patient reported he does not really see a doctor anymore.  Used to see Dr. Thea Silversmith but he retired.  Also found to have elevated CK of over 3000.  I feels patient benefit from IV hydration for this.  X-ray of left hip done and overall reassuring CT scan done due to severe pain.  His bad arthritis and has swelling over the greater trochanter but no fracture seen.  Will discuss with unassigned medicine for admission.      Benjiman Core, MD 10/21/20 [phone]

## 2020-10-21 NOTE — H&P (Addendum)
Date: 10/21/2020               Patient Name:  Victoria Owens MRN: [id number]  DOB: 30-Nov-1945 Age / Sex: 75 y.o., female   PCP: Thayer Headings, MD (Inactive)         Medical Service: Internal Medicine Teaching Service         Attending Physician: Dr. Earl Lagos, MD    First Contact: Dr. Laddie Aquas Pager: [phone]  Second Contact: Dr. Marchia Bond Pager: [phone]       After Hours (After 5p/  First Contact Pager: [phone]  weekends / holidays): Second Contact Pager: [phone]   Chief Complaint: Hip pain, fall  History of Present Illness: This is a 75 year old female with a history of hypertension, osteoarthritis of the hips, and lower extremity swelling who is presenting after a unwitnessed fall.  She was found on the floor by her family members, down in a puddle of urine, is unclear how long she was on the floor, family does not know the last time someone spoke with her. She is been reporting left hip and left shoulder pain.   Patient reports that she fell all and she was walking indoors, she thinks she may have tripped over her cane, she denied any symptoms prior to this fall including chest pain, shortness of breath, palpitations, headaches, lightheadedness, dizziness, or other symptoms.  She denies any fevers, chills, nausea, vomiting, abdominal pain, headaches, or any other symptoms over the past few days.  She typically will use a cane or a walker while she is at home.  She denies any recent changes to her medications.  She has been having left hip pain, reports that she always has left hip pain.  She has a history of bilateral hip osteoarthritis and had a right hip replacement a few years ago, needs to have a left hip replacement done in the future.  She states that her PCP recently passed away and does not have a current PCP.  Spoke with daughter, Marcelino Duster, to cooperate the story.  Daughter states that the patient has been having memory issues.  She has a history of urinary  incontinence and drinks water all day the urinary incontinence that they saw was not surprising to them.  She states that the patient wasn't confused when she was found. Looked like she fell asleep. Sometimes uses a cane at home.  She states that she has multiple family members around the area that come and help with her cooking and her bills.  However the patient is able to do her other ADLs.  In the ED, patient was noted to be afebrile with stable vital signs.  Labs showed a normal CBC.  BMP showed a sodium 135, potassium 3.1, chloride 103, bicarb 20, glucose 189, creatinine 1.20(last recorded creatinine 0.6), albumin 3.2, AST 110, ALT 33.  CK was 3400.  Left shoulder x-ray showed minimal soft tissue thickening, no discernible fracture or traumatic ostial injury.  Left hip x-ray showed severe osteoarthritic changes, no acute fracture or dislocation.  Left hip CT showed severe degenerative changes with no acute fracture or dislocation, small hematoma noted in the subcutaneous tissues, no obvious muscle tear/intramuscular hematoma.  Patient was given 1 L LR and admitted to IMTS.  Meds:  Current Meds  Medication Sig  . hydrochlorothiazide (MICROZIDE) 12.5 MG capsule Take 12.5 mg by mouth every morning.  . Multiple Vitamins-Minerals (ONE-A-DAY WOMENS 50+) TABS Take 1 tablet by mouth daily.   Allergies: Allergies as  of 10/20/2020 - Review Complete 10/20/2020  Allergen Reaction Noted  . Penicillins Itching and Rash 10/28/2015   Past Medical History:  Diagnosis Date  . Arthritis    Osteoarthritis -hips  . Edema extremities    lower extremities flutuates- decreases if elevated.  . Family history of adverse reaction to anesthesia    awakens slowly from anesthesia  . Hypertension     Family History: Father had hypertension and lung cancer.  No other significant family history noted.  Social History: Denies smoking, alcohol, recreational drug use.  Currently is retired, used to work as a Runner, broadcasting/film/video.   Multiple siblings live in the area, as well as her daughter, Marcelino Duster.  She is able to bathe, feed, and clothe herself.  Uses a cane to get around.  Review of Systems: A complete ROS was negative except as per HPI.   Physical Exam: Blood pressure (!) 132/54, pulse 77, temperature 98 F (36.7 C), temperature source Oral, resp. rate 16, SpO2 100 %. Physical Exam Constitutional:      General: She is not in acute distress.    Appearance: Normal appearance.  HENT:     Head: Normocephalic and atraumatic.     Right Ear: Tympanic membrane normal.     Left Ear: Tympanic membrane normal.     Nose: Nose normal.     Mouth/Throat:     Mouth: Mucous membranes are moist.     Pharynx: Oropharynx is clear.  Eyes:     Extraocular Movements: Extraocular movements intact.     Conjunctiva/sclera: Conjunctivae normal.     Pupils: Pupils are equal, round, and reactive to light.  Cardiovascular:     Rate and Rhythm: Normal rate and regular rhythm.     Pulses: Normal pulses.     Heart sounds: Normal heart sounds.  Pulmonary:     Effort: Pulmonary effort is normal. No respiratory distress.     Breath sounds: Normal breath sounds.  Abdominal:     General: Abdomen is flat. Bowel sounds are normal.     Palpations: Abdomen is soft.  Musculoskeletal:        General: Tenderness (TTP over left hip joint, mild bruising noted) present.     Cervical back: Normal range of motion.  Skin:    General: Skin is warm and dry.     Capillary Refill: Capillary refill takes less than 2 seconds.  Neurological:     General: No focal deficit present.     Mental Status: She is alert and oriented to person, place, and time.  Psychiatric:        Mood and Affect: Mood normal.        Behavior: Behavior normal.     EKG: personally reviewed my interpretation is EKG showed normal sinus rhythm, wandering baseline, heart rate 100, occasional PVC no acute ST changes  Left hip CT: IMPRESSION: 1. Severe degenerative changes  involving the left hip but no acute hip fracture or dislocation. 2. Subcutaneous soft tissue swelling/edema/fluid and small hematoma noted in the subcutaneous tissues overlying the region of the greater trochanter. 3. No obvious muscle tear/intramuscular hematoma.   Left hip x-ray: IMPRESSION: 1. No acute fracture or dislocation. 2. Severe osteoarthritic changes of the left hip.  Left shoulder x-ray: IMPRESSION: 1. Minimal soft tissue thickening at the acromioclavicular joint, correlate for point tenderness to exclude a Rockwood type 1 injury. 2. No discernible fracture or traumatic osseous injury within the significant limitations of this exam given a single frontal only view. If  there is persisting concern, complete radiographic series cross-sectional imaging could be obtained. 3. Corticated mineralization in the inferior recess may reflect a joint body.  Assessment & Plan by Problem: Active Problems:   AKI (acute kidney injury) (HCC)  AKI: Elevated CK: CK is 3400, creatinine 1.20, last creatinine was 0.6.  Likely secondary to her mechanical fall and unknown time down.  Patient has been eating and drinking with no issues.  Denies any decreased p.o. intake.  Received 1 L LR and started on maintenance fluids.  This is likely in setting of mild dehydration and her traumatic fall.  -Continue LR 75 cc/h -Repeat CK later today -Repeat BMP in a.m. -Strict I's and O's -Encourage p.o. intake  Hypokalemia: Potassium down to 3.1 on admission, unclear etiology.  Denied any decreased p.o. intake.  Currently on HCTZ at home.  Repleting potassium and monitoring with daily BMP. -Daily BMP  Mechanical fall: Left hip osteoarthritis: Patient was found on the floor at home, and a urinary puddle.  Patient has issues with memory however she reported that she had tripped over her cane and had difficulty getting up.  She denied any prodromal symptoms such as chest pain, palpitations,  shortness of breath, lightheadedness, dizziness.  The urinary incontinence is concerning however daughter states that patient has been having issues with urinary incontinence, she was not present when the episode occurred however stated that her mother may have been rushing to use the restroom and may have tripped.  Patient did not have any post ictal symptoms when family arrived.  Her labs are relatively unremarkable except for above.  Vital signs are stable.  Imaging shows no evidence of fracture in her shoulder or in her hip.  Does show the severe degenerative changes which patient and family is aware about.  Patient may require left hip replacement in the future.  Patient reported that she does not have a PCP at this time.  -Check orthostatic vitals -Placed on telemetry -Start maintenance fluids as above -Tylenol for pain control -PT/OT evaluation -TOC for PCP needs  Hypertension: Patient is on hydrochlorothiazide 12.5 mg daily at home.  Denies any recent changes to her blood pressure medication.  Blood pressure today stable around 120/60s.  Checking orthostatic vital signs for evaluation more.  Will hold home BP medications for now.  -Hold home HCTZ  Memory issues: Patient reports having issues with her memory and family reports concerns for dementia.  Advised to follow-up with her PCP for dementia assessment.  Dispo: Admit patient to Observation with expected length of stay less than 2 midnights.  Signed: Claudean Severance, MD 10/21/2020, 1:39 PM  Pager: [phone] After 5pm on weekdays and 1pm on weekends: On Call pager: [phone]

## 2020-10-21 NOTE — ED Notes (Signed)
Pt made comfortable on stretcher. Given warm blanket. Family at bedside.

## 2020-10-21 NOTE — ED Provider Notes (Signed)
[facility] EMERGENCY DEPARTMENT Provider Note   CSN: [id number] Arrival date & time: 10/20/20  1949   History Chief Complaint  Patient presents with  . Fall    Victoria Owens is a 75 y.o. female.  The history is provided by a relative. The history is limited by the condition of the patient (Dementia).  Fall  She has history of hypertension and was found on the floor by family members.  She was in a puddle of urine and it is not known how long she was on the floor.  Family member was here does not know the last time anyone had spoken with her.  Patient does not remember any of the events.  She apparently is complaining of some pain in her left hip.  She apparently had also complained of some left shoulder pain.  Patient has no complaints while laying in the stretcher.  Past Medical History:  Diagnosis Date  . Arthritis    Osteoarthritis -hips  . Edema extremities    lower extremities flutuates- decreases if elevated.  . Family history of adverse reaction to anesthesia    awakens slowly from anesthesia  . Hypertension     Patient Active Problem List   Diagnosis Date Noted  . OA (osteoarthritis) of hip 11/10/2015    Past Surgical History:  Procedure Laterality Date  . TOTAL HIP ARTHROPLASTY Right 11/10/2015   Procedure: RIGHT TOTAL HIP ARTHROPLASTY ANTERIOR APPROACH;  Surgeon: Ollen Gross, MD;  Location: [facility] ORS;  Service: Orthopedics;  Laterality: Right;     OB History   No obstetric history on file.     History reviewed. No pertinent family history.  Social History   Tobacco Use  . Smoking status: Never Smoker  . Smokeless tobacco: Never Used  Substance Use Topics  . Alcohol use: No  . Drug use: No    Home Medications Prior to Admission medications   Medication Sig Start Date End Date Taking? Authorizing Provider  hydrochlorothiazide (MICROZIDE) 12.5 MG capsule Take 12.5 mg by mouth every morning.   Yes [provider]   Multiple Vitamins-Minerals (ONE-A-DAY WOMENS 50+) TABS Take 1 tablet by mouth daily.   Yes [provider]    Allergies    Penicillins  Review of Systems   Review of Systems  Unable to perform ROS: Dementia    Physical Exam Updated Vital Signs BP 126/65   Pulse 79   Temp 97.9 F (36.6 C)   Resp 18   SpO2 98%   Physical Exam Vitals and nursing note reviewed.   75 year old female, resting comfortably and in no acute distress. Vital signs are normal. Oxygen saturation is 98%, which is normal. Head is normocephalic and atraumatic. PERRLA, EOMI. Oropharynx is clear. Neck is nontender and supple without adenopathy or JVD. Back is nontender and there is no CVA tenderness. Lungs are clear without rales, wheezes, or rhonchi. Chest is nontender.  Specifically, no tenderness over the clavicles or AC joints. Heart has regular rate and rhythm without murmur. Abdomen is soft, flat, nontender without masses or hepatosplenomegaly and peristalsis is normoactive. Extremities: There is no shortening or rotation of legs but there is significant tenderness to palpation over the lateral aspect of the left hip.  There is pain with any passive movement of the left hip.  Full range of motion present all other joints without pain. Skin is warm and dry without rash. Neurologic: Awake and alert, cranial nerves are intact, there are no motor  or sensory deficits.  ED Results / Procedures / Treatments   Labs (all labs ordered are listed, but only abnormal results are displayed) Labs Reviewed  COMPREHENSIVE METABOLIC PANEL - Abnormal; Notable for the following components:      Result Value   Potassium 3.1 (*)    CO2 20 (*)    Glucose, Bld 189 (*)    BUN 27 (*)    Creatinine, Ser 1.20 (*)    Albumin 3.2 (*)    AST 110 (*)    GFR, Estimated 47 (*)    All other components within normal limits  CBC WITH DIFFERENTIAL/PLATELET - Abnormal; Notable for the following components:   Lymphs Abs 0.5  (*)    All other components within normal limits  URINALYSIS, ROUTINE W REFLEX MICROSCOPIC  CK    EKG EKG Interpretation  Date/Time:  Monday October 20 2020 21:09:34 EDT Ventricular Rate:  100 PR Interval:  136 QRS Duration: 70 QT Interval:  374 QTC Calculation: 482 R Axis:   65 Text Interpretation: Sinus rhythm with occasional Premature ventricular complexes Right atrial enlargement Nonspecific ST abnormality Abnormal ECG When compared with ECG of EARLIER SAME DATE Premature ventricular complexes are no longer present Confirmed by Dione Booze ([id number]) on 10/21/2020 2:35:25 AM   Radiology DG Shoulder 1 View Left  Result Date: 10/20/2020 CLINICAL DATA:  Fall, shoulder pain EXAM: LEFT SHOULDER COMPARISON:  None. FINDINGS: Single AP view of the shoulder limiting evaluation of alignment and detection of subtle fractures. No discernible gross fracture or traumatic osseous injury is seen within the significant limitations of this exam. Corticated mineralization in the inferior recess may reflect a joint body. Minimal soft tissue thickening at the acromioclavicular joint, correlate for point tenderness to exclude a Rockwood type 1 injury. Suspect small calcified granuloma in the left upper lung. Included portions of the chest wall are unremarkable. IMPRESSION: 1. Minimal soft tissue thickening at the acromioclavicular joint, correlate for point tenderness to exclude a [place]wood type 1 injury. 2. No discernible fracture or traumatic osseous injury within the significant limitations of this exam given a single frontal only view. If there is persisting concern, complete radiographic series cross-sectional imaging could be obtained. 3. Corticated mineralization in the inferior recess may reflect a joint body. Electronically Signed   By: Kreg Shropshire M.D.   On: 10/20/2020 22:33   DG Hip Unilat With Pelvis 2-3 Views Left  Result Date: 10/21/2020 CLINICAL DATA:  75 year old female with fall. EXAM: DG HIP  (WITH OR WITHOUT PELVIS) 2-3V LEFT COMPARISON:  Pelvic radiograph dated 11/10/2015. FINDINGS: There is a total right hip arthroplasty. There is severe osteoarthritic changes of the left hip with complete loss of joint space and bone-on-bone contact. There is no acute fracture or dislocation. The bones are osteopenic. The soft tissues are unremarkable. Calcified uterine fibroid. IMPRESSION: 1. No acute fracture or dislocation. 2. Severe osteoarthritic changes of the left hip. Electronically Signed   By: Elgie Collard M.D.   On: 10/21/2020 02:47    Procedures Procedures   Medications Ordered in ED Medications  lactated ringers bolus 1,000 mL (has no administration in time range)    ED Course  I have reviewed the triage vital signs and the nursing notes.  Pertinent labs & imaging results that were available during my care of the patient were reviewed by me and considered in my medical decision making (see chart for details).  MDM Rules/Calculators/A&P Apparent fall with injury to left hip.  Left hip and left shoulder  x-rays had been obtained at triage and showed no obvious fracture although there was some question of possible left AC joint injury.  Labs showed hypokalemia and mild elevation of creatinine and AST.  Last creatinine on record was from 2017, but I am concerned that she may actually have some degree of dehydration and she is given IV fluids.  Hemoglobin is also higher than previous which would also be consistent with dehydration.  She was given oral potassium.  Elevated AST could indicate some degree of rhabdomyolysis, so we will check CK level.  Her clinical exam is strongly suggestive of left hip fracture, so she will be sent for CT scan.  Urinalysis is also ordered.  Old records are reviewed, and she has no relevant past visits.  Case is signed out to Dr. Rubin Payor.  Final Clinical Impression(s) / ED Diagnoses Final diagnoses:  Fall at home, initial encounter  Pain in joint of  left hip  Elevated serum creatinine  Elevated AST (SGOT)  Hypokalemia    Rx / DC Orders ED Discharge Orders    None       Dione Booze, MD 10/21/20 [phone]

## 2020-10-21 NOTE — ED Notes (Signed)
Patient's sister at bedside provided with update.

## 2020-10-21 NOTE — ED Notes (Signed)
Spoke with daughter Marcelino Duster on phone. All of pts medical information needs to go through he d/t size of family.

## 2020-10-21 NOTE — ED Notes (Signed)
Attempted to notify Marcelino Duster, daughter, that pt was moved to room 40.

## 2020-10-21 NOTE — ED Notes (Signed)
Pt given phone and family member transferred successfully! Pt resting and comfortable, speaking with family at this time.

## 2020-10-21 NOTE — Evaluation (Signed)
Physical Therapy Evaluation Patient Details Name: Victoria Owens MRN: [id number] DOB: Nov 13, 1945 Today's Date: 10/21/2020   History of Present Illness  Pt is a 75 y/o female admitted 3/21 after being found down by family. Pt with AKI. Imaging negative for acute abnormality in L hip and shoulder. PMH includes HTN and R THA.  Clinical Impression  Pt admitted secondary to problem above with deficits below. Pt oriented to person only and presenting with difficulty sequencing during mobility tasks. Easily distracted and requiring redirection to task. Requiring mod A for bed mobility and min A to stand. Unable to sequence to take steps at EOB with max multimodal cues. Pt reports she lives alone, but family is "always there". Unsure of level of assist able to be provided by family. Feel she would benefit from SNF level therapies prior to return home. Will continue to follow acutely.     Follow Up Recommendations SNF;Supervision/Assistance - 24 hour (unless family can provide necessary support)    Equipment Recommendations  Rolling walker with 5" wheels    Recommendations for Other Services       Precautions / Restrictions Precautions Precautions: Fall Restrictions Weight Bearing Restrictions: No      Mobility  Bed Mobility Overal bed mobility: Needs Assistance Bed Mobility: Supine to Sit;Sit to Supine     Supine to sit: Mod assist Sit to supine: Mod assist   General bed mobility comments: Mod A for LE and trunk assist. Increased time required and pt with difficulty sequencing. Max multimodal cues for sequencing.    Transfers Overall transfer level: Needs assistance Equipment used: 1 person hand held assist Transfers: Sit to/from Stand Sit to Stand: Min assist         General transfer comment: Min A for lift assist and steadying to stand. Pt with difficulty sequencing to take side step at EOB even with multimodal cues, so further mobility limited.  Ambulation/Gait                 Stairs            Wheelchair Mobility    Modified Rankin (Stroke Patients Only)       Balance Overall balance assessment: Needs assistance Sitting-balance support: Feet supported;Bilateral upper extremity supported Sitting balance-Leahy Scale: Poor Sitting balance - Comments: Reliant on BUE support   Standing balance support: Single extremity supported Standing balance-Leahy Scale: Poor Standing balance comment: Reliant on at least 1 UE and external support                             Pertinent Vitals/Pain Pain Assessment: Faces Faces Pain Scale: Hurts even more Pain Location: L hip Pain Descriptors / Indicators: Aching;Grimacing;Guarding Pain Intervention(s): Limited activity within patient's tolerance;Monitored during session;Repositioned    Home Living Family/patient expects to be discharged to:: Private residence Living Arrangements: Alone Available Help at Discharge: Family;Available 24 hours/day Type of Home: House Home Access: Stairs to enter Entrance Stairs-Rails: Left Entrance Stairs-Number of Steps: 1 (porch) Home Layout: One level Home Equipment: Shower seat      Prior Function Level of Independence: Needs assistance   Gait / Transfers Assistance Needed: Uses cane for ambulation  ADL's / Homemaking Assistance Needed: Daughter assists with shower and dressing        Hand Dominance        Extremity/Trunk Assessment   Upper Extremity Assessment Upper Extremity Assessment: Defer to OT evaluation (Reports of L shoulder pain)  Lower Extremity Assessment Lower Extremity Assessment: LLE deficits/detail LLE Deficits / Details: L hip pain since fall. Noted blisters on L hip as well; notified RN    Cervical / Trunk Assessment Cervical / Trunk Assessment: Kyphotic  Communication   Communication: No difficulties  Cognition Arousal/Alertness: Awake/alert Behavior During Therapy: WFL for tasks  assessed/performed Overall Cognitive Status: No family/caregiver present to determine baseline cognitive functioning                                 General Comments: Slow processing noted and difficulty sequencing. Required multimodal cues to perform mobility tasks. Pt only oriented to person.      General Comments General comments (skin integrity, edema, etc.): No family present    Exercises     Assessment/Plan    PT Assessment Patient needs continued PT services  PT Problem List Decreased strength;Decreased activity tolerance;Decreased range of motion;Decreased balance;Decreased mobility;Decreased cognition;Decreased knowledge of use of DME;Decreased safety awareness;Decreased knowledge of precautions;Pain       PT Treatment Interventions DME instruction;Gait training;Functional mobility training;Therapeutic activities;Therapeutic exercise;Balance training;Patient/family education;Stair training    PT Goals (Current goals can be found in the Care Plan section)  Acute Rehab PT Goals Patient Stated Goal: none stated PT Goal Formulation: With patient Time For Goal Achievement: 11/04/20 Potential to Achieve Goals: Good    Frequency Min 2X/week   Barriers to discharge        Co-evaluation               AM-PAC PT "6 Clicks" Mobility  Outcome Measure Help needed turning from your back to your side while in a flat bed without using bedrails?: A Lot Help needed moving from lying on your back to sitting on the side of a flat bed without using bedrails?: A Lot Help needed moving to and from a bed to a chair (including a wheelchair)?: A Lot Help needed standing up from a chair using your arms (e.g., wheelchair or bedside chair)?: A Little Help needed to walk in hospital room?: A Lot Help needed climbing 3-5 steps with a railing? : A Lot 6 Click Score: 13    End of Session Equipment Utilized During Treatment: Gait belt Activity Tolerance: Patient tolerated  treatment well Patient left: in bed;with call bell/phone within reach (on stretcher in ED) Nurse Communication: Mobility status;Other (comment) (blisters on L hip) PT Visit Diagnosis: Unsteadiness on feet (R26.81);Muscle weakness (generalized) (M62.81);History of falling (Z91.81);Pain Pain - Right/Left: Left Pain - part of body: Hip    Time: 0347-4259 PT Time Calculation (min) (ACUTE ONLY): 16 min   Charges:   PT Evaluation $PT Eval Moderate Complexity: 1 Mod          Farley Ly, PT, DPT  Acute Rehabilitation Services  Pager: [phone] Office: [phone]   Lehman Prom 10/21/2020, 1:45 PM

## 2020-10-21 NOTE — ED Notes (Signed)
Patient cleansed of loose, light brown BM. Linens changed.

## 2020-10-21 NOTE — ED Notes (Signed)
Pt reporting to registration that her L hip is hurting; xray placed

## 2020-10-22 ENCOUNTER — Other Ambulatory Visit: Payer: Self-pay

## 2020-10-22 DIAGNOSIS — R7989 Other specified abnormal findings of blood chemistry: Secondary | ICD-10-CM

## 2020-10-22 DIAGNOSIS — D500 Iron deficiency anemia secondary to blood loss (chronic): Secondary | ICD-10-CM | POA: Diagnosis not present

## 2020-10-22 DIAGNOSIS — M169 Osteoarthritis of hip, unspecified: Secondary | ICD-10-CM | POA: Diagnosis not present

## 2020-10-22 LAB — CBC
HCT: 37 % (ref 36.0–46.0)
HCT: 33.5 % — ABNORMAL LOW (ref 36.0–46.0)
Hemoglobin: 12.3 g/dL (ref 12.0–15.0)
Hemoglobin: 11.4 g/dL — ABNORMAL LOW (ref 12.0–15.0)
MCH: 30 pg (ref 26.0–34.0)
MCH: 30.6 pg (ref 26.0–34.0)
MCHC: 33.2 g/dL (ref 30.0–36.0)
MCHC: 34 g/dL (ref 30.0–36.0)
MCV: 90.2 fL (ref 80.0–100.0)
MCV: 90.1 fL (ref 80.0–100.0)
Platelets: 204 10*3/uL (ref 150–400)
Platelets: 217 10*3/uL (ref 150–400)
RBC: 4.1 MIL/uL (ref 3.87–5.11)
RBC: 3.72 MIL/uL — ABNORMAL LOW (ref 3.87–5.11)
RDW: 12.5 % (ref 11.5–15.5)
RDW: 12.8 % (ref 11.5–15.5)
WBC: 5.7 10*3/uL (ref 4.0–10.5)
WBC: 5.2 10*3/uL (ref 4.0–10.5)
nRBC: 0 % (ref 0.0–0.2)
nRBC: 0 % (ref 0.0–0.2)

## 2020-10-22 LAB — BASIC METABOLIC PANEL
Anion gap: 6 (ref 5–15)
BUN: 18 mg/dL (ref 8–23)
CO2: 25 mmol/L (ref 22–32)
Calcium: 8.8 mg/dL — ABNORMAL LOW (ref 8.9–10.3)
Chloride: 106 mmol/L (ref 98–111)
Creatinine, Ser: 0.76 mg/dL (ref 0.44–1.00)
GFR, Estimated: >60 mL/min (ref >60)
Glucose, Bld: 111 mg/dL — ABNORMAL HIGH (ref 70–99)
Potassium: 4.8 mmol/L (ref 3.5–5.1)
Sodium: 137 mmol/L (ref 135–145)

## 2020-10-22 LAB — CK: Total CK: 1742 U/L — ABNORMAL HIGH (ref 38–234)

## 2020-10-22 MED ORDER — SODIUM CHLORIDE 0.9 % IV BOLUS
500.0000 mL | Freq: Once | INTRAVENOUS | Status: AC
  Administered 2020-10-22: 500 mL via INTRAVENOUS

## 2020-10-22 NOTE — Evaluation (Addendum)
Occupational Therapy Evaluation Patient Details Name: Victoria Owens MRN: [id number] DOB: Dec 27, 1945 Today's Date: 10/22/2020    History of Present Illness Pt is a 75 y/o female admitted 3/21 after being found down by family. Pt with AKI. Imaging negative for acute abnormality in L hip and shoulder. PMH includes HTN and R THA.   Clinical Impression   Pt PTA: pt living with family assisting daily. Per grandson, her mentation has worsened in the last 2 months. Pt limited by decreased cognition and decreased ability to care for self safely. Pt's grandson in room and confirmed that pt has assist at home. Pt usually feeding self and able to ambulate in home. Grandson present at end of session.  Pt set-upA to maxA for ADL and modA overall for mobility. Pt's BP +orthostatics BP supine: 108/59, 89 BPM; sitting 137/62, 93 BPM; 99/57 101 BPM. RN aware. Pt would benefit from continued OT  Skilled services acutely and post acutely. Pt appears to have supportive family to assist as needed. OT following acutely.    Follow Up Recommendations  SNF;Home health OT;Supervision/Assistance - 24 hour (pt requiring 24/7 supervision in post acute care setting.)    Equipment Recommendations  3 in 1 bedside commode    Recommendations for Other Services       Precautions / Restrictions Precautions Precautions: Fall;Other (comment) Precaution Comments: + orthostatics Restrictions Weight Bearing Restrictions: No      Mobility Bed Mobility Overal bed mobility: Needs Assistance Bed Mobility: Supine to Sit;Sit to Supine     Supine to sit: Mod assist Sit to supine: Mod assist   General bed mobility comments: Mod A for BLE and trunk assist. Increased time required and pt with difficulty sequencing. Max multimodal cues for sequencing.    Transfers Overall transfer level: Needs assistance Equipment used: 1 person hand held assist Transfers: Sit to/from Stand Sit to Stand: Min assist         General  transfer comment: MinA for power up; multimodal cues to sequence through steps to recliner.    Balance Overall balance assessment: Needs assistance Sitting-balance support: Feet supported;Bilateral upper extremity supported Sitting balance-Leahy Scale: Poor Sitting balance - Comments: Reliant on BUE support   Standing balance support: Single extremity supported Standing balance-Leahy Scale: Poor Standing balance comment: Reliant on BUEs on [place] external support                           ADL either performed or assessed with clinical judgement   ADL Overall ADL's : Needs assistance/impaired Eating/Feeding: Supervision/ safety;Bed level   Grooming: Set up;Sitting   Upper Body Bathing: Minimal assistance;Sitting   Lower Body Bathing: Maximal assistance;Sit to/from stand;Bed level Lower Body Bathing Details (indicate cue type and reason): Per Grandson, pt performing very litel LB ADL, daughter assists usually Upper Body Dressing : Set up;Sitting   Lower Body Dressing: Maximal assistance;Sitting/lateral leans;Bed level;Sit to/from stand   Toilet Transfer: Min guard;Ambulation;Cueing for safety;Transfer board;Stand-pivot;BSC   Toileting- Clothing Manipulation and Hygiene: Total assistance Toileting - Clothing Manipulation Details (indicate cue type and reason): purewick present     Functional mobility during ADLs: Minimal assistance;Rolling walker;Cueing for safety General ADL Comments: Pt limited by decreased cognition and decreased ability to care for self safely. Pt's grandson in room and confirmed that pt has assist at home. Pt usually feeding self and able to ambulate in home.     Vision Baseline Vision/History: No visual deficits Vision Assessment?: No apparent visual deficits  Perception     Praxis      Pertinent Vitals/Pain Pain Assessment: Faces Faces Pain Scale: Hurts a little bit Pain Location: L hip Pain Descriptors / Indicators:  Aching;Grimacing;Guarding Pain Intervention(s): Monitored during session     Hand Dominance Right   Extremity/Trunk Assessment Upper Extremity Assessment Upper Extremity Assessment: Generalized weakness   Lower Extremity Assessment LLE Deficits / Details: L hip pain since fall. Noted blisters on L hip as well; notified RN   Cervical / Trunk Assessment Cervical / Trunk Assessment: Kyphotic   Communication Communication Communication: No difficulties   Cognition Arousal/Alertness: Awake/alert Behavior During Therapy: WFL for tasks assessed/performed Overall Cognitive Status: No family/caregiver present to determine baseline cognitive functioning                                 General Comments: Slow processing noted and difficulty (motor) sequencing. Required multimodal cues to perform mobility tasks. Pt only oriented to person, aware of month and year. Pt difficulty finding clock in room.   General Comments  Grandson present at end of session. BP +orthostatics BP supine: 108/59, 89 BPM; sitting 137/62, 93 BPM; 99/57 101 BPM.    Exercises     Shoulder Instructions      Home Living Family/patient expects to be discharged to:: Private residence Living Arrangements: Alone Available Help at Discharge: Family;Available 24 hours/day Type of Home: House Home Access: Stairs to enter [employer] of Steps: 1 (porch) Entrance Stairs-Rails: Left Home Layout: One level     Bathroom Shower/Tub: Producer, television/film/video: Standard     Home Equipment: Shower seat          Prior Functioning/Environment Level of Independence: Needs assistance  Gait / Transfers Assistance Needed: Uses cane for ambulation ADL's / Homemaking Assistance Needed: Daughter assists with shower and dressing            OT Problem List: Decreased strength;Decreased activity tolerance;Impaired balance (sitting and/or standing);Decreased safety awareness;Pain;Increased  edema;Decreased cognition      OT Treatment/Interventions: Self-care/ADL training;Therapeutic exercise;Energy conservation;DME and/or AE instruction;Therapeutic activities;Patient/family education;Balance training;Cognitive remediation/compensation    OT Goals(Current goals can be found in the care plan section) Acute Rehab OT Goals Patient Stated Goal: none stated OT Goal Formulation: With patient Time For Goal Achievement: 11/05/20 Potential to Achieve Goals: Good ADL Goals Pt Will Perform Grooming: with supervision;standing Pt Will Transfer to Toilet: with min guard assist;ambulating;bedside commode Additional ADL Goal #1: Pt will consistently follow 1 step commands with no cues to attend to task in  minimall distracting environment. Additional ADL Goal #2: Pt will stand x3 mins for OOB ADL with minA overall in order to increase independence.  OT Frequency: Min 2X/week   Barriers to D/C:            Co-evaluation              AM-PAC OT "6 Clicks" Daily Activity     Outcome Measure Help from another person eating meals?: A Little Help from another person taking care of personal grooming?: A Little Help from another person toileting, which includes using toliet, bedpan, or urinal?: A Lot Help from another person bathing (including washing, rinsing, drying)?: A Lot Help from another person to put on and taking off regular upper body clothing?: A Little Help from another person to put on and taking off regular lower body clothing?: A Lot 6 Click Score: 15   End of  Session Equipment Utilized During Treatment: Gait belt;Rolling walker Nurse Communication: Mobility status;Other (comment) (orthostatic)  Activity Tolerance: Patient tolerated treatment well Patient left: in chair;with chair alarm set;with family/visitor present;with call bell/phone within reach  OT Visit Diagnosis: Unsteadiness on feet (R26.81);Muscle weakness (generalized) (M62.81)                Time:  4235-3614 OT Time Calculation (min): 40 min Charges:  OT General Charges $OT Visit: 1 Visit OT Evaluation $OT Eval Moderate Complexity: 1 Mod OT Treatments $Self Care/Home Management : 8-22 mins $Therapeutic Activity: 8-22 mins  Flora Lipps, OTR/L Acute Rehabilitation Services Pager: [phone] Office: [phone]   [facility] Cellar 10/22/2020, 2:44 PM

## 2020-10-22 NOTE — TOC Initial Note (Signed)
Transition of Care [facility]) - Initial/Assessment Note    Patient Details  Name: Victoria Owens MRN: [id number] Date of Birth: 1945/08/24  Transition of Care [facility]) CM/SW Contact:    Joanne Chars, LCSW Phone Number: 10/22/2020, 3:38 PM  Clinical Narrative: CSW met with pt regarding discharge recommendations.  Pt has been to SNF before, prefers [facility] if possible. Pt reports her granddaughter, daughter, and other family members provide a lot of support, even though she lives alone.   Permission given to speak with daughter Sharyn Lull.   Pt reports she is vaccinated for covid.    CSW spoke with daughter Sharyn Lull several times throughout the afternoon.  Family is able to provide 24/7 support, would like walker and 3n1.  Daughter had several medical questions and Jenne Pane will call her. Initial plan for DC tomorrow was changed to DC today and family able to work with this time frame.  CSW also discussed MOON with Sharyn Lull as pt has some level of confusion.  [place] letter provided to Michelle's son who was in pt room.                   Expected Discharge Plan: [place] Barriers to Discharge: No Barriers Identified   Patient Goals and CMS Choice   CMS Medicare.gov Compare Post Acute Care list provided to:: Patient Choice offered to / list presented to : Patient  Expected Discharge Plan and Services Expected Discharge Plan: [place] Choice: [place] arrangements for the past 2 months: Single Family Home                 DME Arranged: 3-N-1,Walker rolling DME Agency: [employer] Date DME Agency Contacted: 10/22/20 Time DME Agency Contacted: 1400 Representative spoke with at DME Agency: Freda Munro [place]: PT,OT,Nurse's Aide [place]: [place] Date [place]: 10/22/20 Time [place]: [place] Representative spoke with at [place]: Levada Dy  Prior Living Arrangements/Services Living  arrangements for the past 2 months: [place] with:: Self Patient language and need for interpreter reviewed:: Yes Do you feel safe going back to the place where you live?: Yes      Need for Family Participation in Patient Care: Yes (Comment) Care giver support system in place?: Yes (comment) Current home services: Other (comment) (none) Criminal Activity/Legal Involvement Pertinent to Current Situation/Hospitalization: No - Comment as needed  Activities of Daily Living Home Assistive Devices/Equipment: Cane (specify quad or straight) ADL Screening (condition at time of admission) Patient's cognitive ability adequate to safely complete daily activities?: No Is the patient deaf or have difficulty hearing?: No Does the patient have difficulty seeing, even when wearing glasses/contacts?: No Does the patient have difficulty concentrating, remembering, or making decisions?: Yes Patient able to express need for assistance with ADLs?: Yes Does the patient have difficulty dressing or bathing?: Yes Independently performs ADLs?: No Does the patient have difficulty walking or climbing stairs?: Yes Weakness of Legs: Both Weakness of Arms/Hands: Both  Permission Sought/Granted Permission sought to share information with : Family Supports Permission granted to share information with : Yes, Verbal Permission Granted  Share Information with NAME: daughter Sharyn Lull  Permission granted to share info w AGENCY: HH        Emotional Assessment Appearance:: Appears stated age Attitude/Demeanor/Rapport: Engaged Affect (typically observed): Pleasant Orientation: : Oriented to Self,Oriented to Place,Oriented to  Time,Oriented to Situation Alcohol / Substance Use: Not Applicable  Psych Involvement: No (comment)  Admission diagnosis:  Hypokalemia [E87.6] Elevated serum creatinine [R79.89] Elevated CK [R74.8] Pain in joint of left hip [M25.552] AKI (acute kidney injury) ([place])  [N17.9] Elevated AST (SGOT) [R74.01] Fall at home, initial encounter [W19.Merril Abbe, M41.583] Patient Active Problem List   Diagnosis Date Noted  . AKI (acute kidney injury) ([place]) 10/21/2020  . OA (osteoarthritis) of hip 11/10/2015   PCP:  Thressa Sheller, MD (Inactive) Pharmacy:   [id number] [place]Phone: [phone]: [phone]    Social Determinants of Health (SDOH) Interventions    Readmission Risk Interventions No flowsheet data found.

## 2020-10-22 NOTE — ED Notes (Signed)
Attempted to call report x2

## 2020-10-22 NOTE — Discharge Summary (Signed)
Name: Victoria Owens MRN: [id number] DOB: 02/25/1946 75 y.o. PCP: None  Date of Admission: 10/20/2020  8:33 PM Date of Discharge: 10/22/20 Attending Physician: Earl Lagos, MD  Discharge Diagnosis: 1. Severe Left Hip Osteoarthritis  2. Small Subcutaneous Hematoma of L hip 3. Acromioclavicular Soft Tissue Thickening  4. Mild Rhabdomyolysis  5. Memory Impairment  6. Orthostatic Hypotension  7. Normocytic Anemia   Discharge Medications: Allergies as of 10/22/2020      Reactions   Penicillins Itching, Rash   Has patient had a PCN reaction causing immediate rash, facial/tongue/throat swelling, SOB or lightheadedness with hypotension: Rash. Arms and Neck.  Has patient had a PCN reaction causing severe rash involving mucus membranes or skin necrosis:  Has patient had a PCN reaction that required hospitalization: No Has patient had a PCN reaction occurring within the last 10 years: Yes If all of the above answers are "NO", then may proceed with Cephalosporin use.      Medication List    STOP taking these medications   hydrochlorothiazide 12.5 MG capsule Commonly known as: MICROZIDE     TAKE these medications   One-A-Day Womens 50+ Tabs Take 1 tablet by mouth daily.            Durable Medical Equipment  (From admission, onward)         Start     Ordered   10/22/20 1538  For home use only DME Walker rolling  Once       Question Answer Comment  Walker: With 5 Inch Wheels   Patient needs a walker to treat with the following condition Hip pain, acute      10/22/20 1537   10/22/20 1537  For home use only DME 3 n 1  Once        10/22/20 1537          Disposition and follow-up:   Victoria Owens was discharged from [facility] in Stable condition.  At the hospital follow up visit please address:  1.  Active Issues: Note - Patient does not currently have an established PCP.  Severe Left Hip OA w/ Small Hematoma / Anemia - Be sure pain is  controlled and hematoma is improving. May require left hip arthroplasty. Continue PT/OT/home aide as needed. Check CBC. Soft Tissue Thickening of Left AC Joint - Please assess for persistent tenderness to rule out a Rockwood type 1 injury  Mild Rhabdomyolysis / Hypokalemia / AKI - Patient had mild AKI with baseline sCr 0.6 with hypokalemia on arrival. Improved with fluids and discontinuation of HCTZ. Please follow up labs.  Orthostatic Hypotension / Fall - Unclear whether fall was mechanical vs. 2/2 orthostatic hypotension. Please check orthostatics and assess for fall frequency. Add back antihypertensive if needed. Memory Impairment - patient would benefit from cognitive evaluation. Concerned she may have underlying dementia.  Hair Loss - Daughter mentioned concerns regarding recent hair loss. Would benefit from thyroid studies given associated memory impairments. Urinary Incontinence - Will need future evaluation, chronic  2.  Labs / imaging needed at time of follow-up: BMP, CBC, consider repeat CK, TSH, free T4, Urinalysis   3.  Pending labs/ test needing follow-up: None  Follow-up Appointments:  Follow-up Information    [facility]. Call in 1 week(s).   Why: You may call [facility] to establish care / schedule a follow up appointment within the next 1-2 weeks.  Contact information: [address] [place] [address] [phone]  Hospital Course by problem list:  Left Hip and Left Shoulder Pain s/p Unwitnessed Fall Victoria Owens was found on the floor at home in a puddle of urine by her family who helps take care of her. She believes she had tripped over her cane and had difficulty getting up. She has baseline memory impairment issues with chronic urinary incontinence per family, who thought the incontinence may be typical for her. Patient denied any prodromal symptoms and felt well previously. After her fall, she complained of left hip and  left shoulder pain. Left shoulder XR showed soft tissue swelling over the [facility] joint and left hip XR and CT did show severe OA changes with a small hematoma, although no signs of fracture or other acute pathology. Patient stated she had already been due for a left hip replacement and has had her right hip replaced. Her pain was controlled with Tylenol alone and she was discharged with PT/OT/home aide to home with her daughter's support.   Mild Rhabdomyolysis and AKI On admission, CK was 3400, creatinine 1.20, last creatinine was 0.6.  This was likely secondary to her fall and unknown time down / acute muscle injury, possibly confounded by dehydration although patient states she had been eating and drinking well. All of her labs improved significantly after 1L LR in the ED, she was started on maintenance fluids and given an additional 500cc bolus of NS prior to discharge with near normal kidney function for her. Encouraged to maintain PO intake on discharge.   Hypokalemia Potassium was initially 3.1. She was on HCTZ which may have contributed as well as possible decreased PO intake although she says she had been eating well. K+ was replaced and HCTZ was discontinued. Will need BMP at follow up appointment.   Hx of HTN Orthostatic Hypotension  Patient had been on HCTZ 25mg  at home, which was recently decreased to 12.5mg  daily, per daughter. This was held on admission, and her blood pressures returned to within normal ranges without any medication. She did have orthostatic hypotension on day of discharge which may have contributed to fall; however, this resolved with additional fluids. Blood pressure medications held at discharge.   Memory Issues Patient was alert and oriented to self and place, but not time. She had troubles recalling recent events. I do have concern for underlying memory impairment, which may benefit from outpatient follow up.   Discharge Subjective: Patient states she continues to  have some left hip pain although denies new symptoms. She denies light-headedness or dizziness, CP, SOB, and states that she had felt well prior to her recent fall. She believes she lost her footing. Denies dysuria, abdominal pain, or any other complaints.   Discharge Exam:   BP (!) 111/56 (BP Location: Right Arm)   Pulse 98   Temp 98.2 F (36.8 C)   Resp 16   Ht 5\' 3"  (1.6 m)   Wt 62.6 kg   SpO2 100%   BMI 24.45 kg/m  Discharge exam:  General: Patient is resting comfortably, in no acute distress. HENT: Neck is supple. MMM. Head is atraumatic.  Respiratory: Lungs are CTA, bilaterally. No wheezes, rales, or rhonchi.  Cardiovascular: Regular rate and rhythm. No murmurs, rubs, or gallops. No lower extremity edema. Abdominal: Soft and non-tender to palpation. Bowel sounds intact. No rebound or guarding. Neurological: Alert and oriented to person and place but not time. Sensation intact in LLE.  MSK: There is tenderness to palpation over the left hip although ROMI.  Skin: There  is a small hematoma over the left hip. No other lesions noted. Psych: Normal affect. Normal tone of voice.   Pertinent Labs, Studies, and Procedures:   Discharge Cr 0.76 CK 3430 > 1742 No leukocytosis Hgb 14.8 > 12.3 > 11.4  Left Shoulder XR 10/20/20: FINDINGS: Single AP view of the shoulder limiting evaluation of alignment and detection of subtle fractures. No discernible gross fracture or traumatic osseous injury is seen within the significant limitations of this exam. Corticated mineralization in the inferior recess may reflect a joint body. Minimal soft tissue thickening at the acromioclavicular joint, correlate for point tenderness to exclude a Rockwood type 1 injury. Suspect small calcified granuloma in the left upper lung. Included portions of the chest wall are unremarkable. IMPRESSION: 1. Minimal soft tissue thickening at the acromioclavicular joint, correlate for point tenderness to exclude a  Rockwood type 1 injury. 2. No discernible fracture or traumatic osseous injury within the significant limitations of this exam given a single frontal only view. If there is persisting concern, complete radiographic series cross-sectional imaging could be obtained. 3. Corticated mineralization in the inferior recess may reflect a joint body. Electronically Signed   By: Kreg Shropshire M.D.   On: 10/20/2020 22:33  DG HIP (WITH OR WITHOUT PELVIS) 2-3V LEFT 10/21/20: COMPARISON:  Pelvic radiograph dated 11/10/2015. FINDINGS: There is a total right hip arthroplasty. There is severe osteoarthritic changes of the left hip with complete loss of joint space and bone-on-bone contact. There is no acute fracture or dislocation. The bones are osteopenic. The soft tissues are unremarkable. Calcified uterine fibroid. IMPRESSION: 1. No acute fracture or dislocation. 2. Severe osteoarthritic changes of the left hip. Electronically Signed   By: Elgie Collard M.D.   On: 10/21/2020 02:47  CT OF THE LEFT HIP WITHOUT CONTRAST COMPARISON:  Radiographs, same date. FINDINGS: The left hip is normally located. Severe degenerative changes with complete cartilage loss, osteophytic spurring, bony eburnation and subchondral cystic change. No acute hip fracture is identified. The left hemipelvis is intact. No pelvic fractures. The pubic symphysis and left SI joint are intact. Subcutaneous soft tissue swelling/edema/fluid overlying the region of the greater trochanter consistent with a contusion. A small hematoma is noted. The underlying hip musculature is grossly normal. No obvious muscle tear or intramuscular hematoma. No significant intrapelvic abnormalities on the left side. A calcified uterine fibroid is noted incidentally. IMPRESSION: 1. Severe degenerative changes involving the left hip but no acute hip fracture or dislocation. 2. Subcutaneous soft tissue swelling/edema/fluid and small  hematoma noted in the subcutaneous tissues overlying the region of the greater trochanter. 3. No obvious muscle tear/intramuscular hematoma. Electronically Signed   By: Rudie Meyer M.D.   On: 10/21/2020 07:56  Discharge Instructions: Discharge Instructions    Call MD for:  extreme fatigue   Complete by: As directed    Call MD for:  persistant dizziness or light-headedness   Complete by: As directed    Call MD for:  severe uncontrolled pain   Complete by: As directed    Diet general   Complete by: As directed    Discharge instructions   Complete by: As directed    Victoria Owens, Victoria Owens   You were admitted to the hospital for workup of fall with left hip pain. X-rays of your left shoulder and hip did not show any evidence of fracture. A CT scan of your left hip confirmed severe arthritic changes of your left hip and showed a hematoma (significant bruising) of your left hip, although  again did not show any findings concerning for fracture or other acute pathology.   You were initially found to have a low potassium and your blood pressure initially dropped upon standing. Both of these can be associated with dehydration, decreased oral intake, and also the HCTZ (blood pressure medication) you had been prescribed outpatient.   Upon discharge from the hospital, it is very important that you continue to eat and drink well.   Please STOP taking your HCTZ. Your blood pressure has been under good control without this in the hospital. A decision will be made whether you will need to restart a blood pressure medication such as amlodipine outpatient.   Please be sure to establish with a new primary care doctor for a hospital follow up appointment within the next 2 weeks. If you with, you may call [phone] to establish care with our [facility] on the [facility].   In the meantime, you will have home health PT/OT and a nurse aid come to the home to assist you  with your new walker and commode and to help you get strong.   Please return to the ED for evaluation if you continue to experience frequent falls, severe weakness or fatigue, new chest pain, severe shortness of breath, or any other concerning symptoms.   Thank you and take care,  Dr. Laddie AquasSpeakman   Increase activity slowly   Complete by: As directed       Signed: Glenford BayleySpeakman, Benigna Delisi, MD 10/22/2020, 5:41 PM   Pager: [phone]

## 2020-10-22 NOTE — Discharge Instructions (Signed)
Fall Prevention in the Home, Adult Falls can cause injuries and can happen to people of all ages. There are many things you can do to make your home safe and to help prevent falls. Ask for help when making these changes. What actions can I take to prevent falls? General Instructions  Use good lighting in all rooms. Replace any light bulbs that burn out.  Turn on the lights in dark areas. Use night-lights.  Keep items that you use often in easy-to-reach places. Lower the shelves around your home if needed.  Set up your furniture so you have a clear path. Avoid moving your furniture around.  Do not have throw rugs or other things on the floor that can make you trip.  Avoid walking on wet floors.  If any of your floors are uneven, fix them.  Add color or contrast paint or tape to clearly mark and help you see: ? Grab bars or handrails. ? First and last steps of staircases. ? Where the edge of each step is.  If you use a stepladder: ? Make sure that it is fully opened. Do not climb a closed stepladder. ? Make sure the sides of the stepladder are locked in place. ? Ask someone to hold the stepladder while you use it.  Know where your pets are when moving through your home. What can I do in the bathroom?  Keep the floor dry. Clean up any water on the floor right away.  Remove soap buildup in the tub or shower.  Use nonskid mats or decals on the floor of the tub or shower.  Attach bath mats securely with double-sided, nonslip rug tape.  If you need to sit down in the shower, use a plastic, nonslip stool.  Install grab bars by the toilet and in the tub and shower. Do not use towel bars as grab bars.      What can I do in the bedroom?  Make sure that you have a light by your bed that is easy to reach.  Do not use any sheets or blankets for your bed that hang to the floor.  Have a firm chair with side arms that you can use for support when you get dressed. What can I do in  the kitchen?  Clean up any spills right away.  If you need to reach something above you, use a step stool with a grab bar.  Keep electrical cords out of the way.  Do not use floor polish or wax that makes floors slippery. What can I do with my stairs?  Do not leave any items on the stairs.  Make sure that you have a light switch at the top and the bottom of the stairs.  Make sure that there are handrails on both sides of the stairs. Fix handrails that are broken or loose.  Install nonslip stair treads on all your stairs.  Avoid having throw rugs at the top or bottom of the stairs.  Choose a carpet that does not hide the edge of the steps on the stairs.  Check carpeting to make sure that it is firmly attached to the stairs. Fix carpet that is loose or worn. What can I do on the outside of my home?  Use bright outdoor lighting.  Fix the edges of walkways and driveways and fix any cracks.  Remove anything that might make you trip as you walk through a door, such as a raised step or threshold.  Trim any   bushes or trees on paths to your home.  Check to see if handrails are loose or broken and that both sides of all steps have handrails.  Install guardrails along the edges of any raised decks and porches.  Clear paths of anything that can make you trip, such as tools or rocks.  Have leaves, snow, or ice cleared regularly.  Use sand or salt on paths during winter.  Clean up any spills in your garage right away. This includes grease or oil spills. What other actions can I take?  Wear shoes that: ? Have a low heel. Do not wear high heels. ? Have rubber bottoms. ? Feel good on your feet and fit well. ? Are closed at the toe. Do not wear open-toe sandals.  Use tools that help you move around if needed. These include: ? Canes. ? Walkers. ? Scooters. ? Crutches.  Review your medicines with your doctor. Some medicines can make you feel dizzy. This can increase your chance  of falling. Ask your doctor what else you can do to help prevent falls. Where to find more information  Centers for Disease Control and Prevention, STEADI: www.cdc.gov  National Institute on Aging: www.nia.nih.gov Contact a doctor if:  You are afraid of falling at home.  You feel weak, drowsy, or dizzy at home.  You fall at home. Summary  There are many simple things that you can do to make your home safe and to help prevent falls.  Ways to make your home safe include removing things that can make you trip and installing grab bars in the bathroom.  Ask for help when making these changes in your home. This information is not intended to replace advice given to you by your health care provider. Make sure you discuss any questions you have with your health care provider. Document Revised: 02/20/2020 Document Reviewed: 02/20/2020 Elsevier Patient Education  2021 Elsevier Inc.  

## 2020-10-22 NOTE — Care Management Obs Status (Signed)
MEDICARE OBSERVATION STATUS NOTIFICATION   Patient Details  Name: Victoria Owens MRN: [id number] Date of Birth: 11/18/1945   Medicare Observation Status Notification Given:  Yes    Lorri Frederick, LCSW 10/22/2020, 1:49 PM

## 2020-10-22 NOTE — TOC Transition Note (Signed)
Transition of Care [facility]) - CM/SW Discharge Note   Patient Details  Name: Victoria Owens MRN: [id number] Date of Birth: 1946-02-08  [facility] CM/SW Contact:  Lorri Frederick, LCSW Phone Number: 10/22/2020, 3:45 PM   Clinical Narrative:   Pt discharging home with [facility].  [employer] provides DME.  Pt daughter or grandson will provide transportation home.     Final next level of care: Home w Home Health Services Barriers to Discharge: No Barriers Identified   Patient Goals and CMS Choice Patient states their goals for this hospitalization and ongoing recovery are:: "flexibility" CMS Medicare.gov Compare Post Acute Care list provided to:: Patient Choice offered to / list presented to : Patient  Discharge Placement                       Discharge Plan and Services     Post Acute Care Choice: Home Health          DME Arranged: 3-N-1,Walker rolling DME Agency: [employer] Date DME Agency Contacted: 10/22/20 Time DME Agency Contacted: 1400 Representative spoke with at DME Agency: Velna Hatchet HH Arranged: PT,OT,Nurse's Aide HH Agency: [facility] Date [facility] Agency Contacted: 10/22/20 Time HH Agency Contacted: 1420 Representative spoke with at [facility] Agency: Marylene Land  Social Determinants of Health (SDOH) Interventions     Readmission Risk Interventions No flowsheet data found.

## 2020-10-22 NOTE — ED Notes (Signed)
Attempted report x1. 

## 2020-10-22 NOTE — Progress Notes (Signed)
Date: 10/22/2020  Patient name: Victoria Owens  Medical record number: [id number]  Date of birth: Mar 03, 1946   I have seen and evaluated Aundria Mems and discussed their care with the Residency Team.  In brief, patient is 75 year old female with past history of hypertension, OA of the hips who presented to the ED after an unwitnessed fall.  History is obtained from the patient and the chart.  Patient states that she was walking indoors and may have tripped and fallen down.  She does not appear to be a reliable historian and does not have a good recall of the fall.  Patient typically uses a cane or walker at home.  Patient has been having worsening left hip pain secondary to osteoarthritis and has been having difficulty ambulating secondary to this.  Patient's PCP also passed away recently and she does not have a current PCP.  Patient has been having memory issues and is being worked up for possible underlying dementia.  Patient also has a history of urinary incontinence.  After the fall, patient lay on the floor though she was found by family.  She was found to have an episode of urinary incontinence.  No chest pain, no shortness of breath, no fevers or chills, no lightheadedness, no syncope, no focal weakness, no nausea or vomiting, no abdominal pain, no diarrhea.  Patient is on hydrochlorothiazide at home chronically for her hypertension.  Today, patient states that she feels well and denies any new complaints.  PMHx, Fam Hx, and/or Soc Hx : As per resident admit note  Vitals:   10/22/20 1521 10/22/20 1524  BP: (!) 110/49 (!) 111/56  Pulse: 93 98  Resp:    Temp:    SpO2:  100%   General: Awake, alert, oriented x2 (not oriented to time), NAD CVS: Regular rhythm, normal heart sounds Lungs: CTA bilaterally Abdomen: Soft, nontender, nondistended, normoactive bowel sounds Extremities: No edema noted, nontender to palpation Psych: Normal mood and affect HEENT: Normocephalic,  atraumatic Skin: Warm and dry  Assessment and Plan: I have seen and evaluated the patient as outlined above. I agree with the formulated Assessment and Plan as detailed in the residents' note, with the following changes:   1.  Status post fall likely secondary to orthostatic hypotension and left hip osteoarthritis: -Patient presented to the ED after an unwitnessed fall at home.  Patient has memory issues and is unable to recall the events accurately.  She was found to be orthostatic here with an AKI which would be consistent with dehydration as a possible etiology for her fall.  Given that the patient did have an episode of urinary incontinence seizure was also considered to be on the differential but she had no evidence of tongue biting and her CK was only mildly elevated consistent with her stay on the floor.  She had no evidence of underlying infection (no fevers, no leukocytosis) which would explain her fall. -Patient AKI has now resolved with IV fluids -Patient's orthostatic hypotension is also resolved with IV fluids -Patient's hemoglobin has decreased to 11.4 from 14 on admission.  I suspect that she was hemoconcentrated on admission.  Hemoglobin from 2017 was in the tens to 11's and I suspect this is her baseline. -PT/OT evaluation appreciated.  Recommend SNF placement but family and patient would like patient to go home with home health -Would hold home blood pressure medications including hydrochlorothiazide which likely contributed to her AKI and orthostasis. -Patient did have an elevated CK to 3000s which  improved to the 1000s today.  Elevated CK was likely secondary to her laying on the floor for extended period of time.  No further work-up at this time -If patient does have recurrent falls would consider seizure work-up as well to rule out this etiology -No further work-up for now.  Patient is stable for DC home with home health today -She will need to establish with a new PCP for  further follow-up as her old PCP is no longer practicing  Earl Lagos, MD 3/23/20225:10 PM

## 2020-10-23 ENCOUNTER — Telehealth: Payer: Self-pay

## 2020-10-25 DIAGNOSIS — I951 Orthostatic hypotension: Secondary | ICD-10-CM | POA: Diagnosis not present

## 2020-10-25 DIAGNOSIS — Z9181 History of falling: Secondary | ICD-10-CM | POA: Diagnosis not present

## 2020-10-25 DIAGNOSIS — I10 Essential (primary) hypertension: Secondary | ICD-10-CM | POA: Diagnosis not present

## 2020-10-25 DIAGNOSIS — M160 Bilateral primary osteoarthritis of hip: Secondary | ICD-10-CM | POA: Diagnosis not present

## 2020-10-29 DIAGNOSIS — I10 Essential (primary) hypertension: Secondary | ICD-10-CM | POA: Diagnosis not present

## 2020-10-29 DIAGNOSIS — Z9181 History of falling: Secondary | ICD-10-CM | POA: Diagnosis not present

## 2020-10-29 DIAGNOSIS — M160 Bilateral primary osteoarthritis of hip: Secondary | ICD-10-CM | POA: Diagnosis not present

## 2020-10-29 DIAGNOSIS — I951 Orthostatic hypotension: Secondary | ICD-10-CM | POA: Diagnosis not present

## 2020-11-03 DIAGNOSIS — I10 Essential (primary) hypertension: Secondary | ICD-10-CM | POA: Diagnosis not present

## 2020-11-03 DIAGNOSIS — I951 Orthostatic hypotension: Secondary | ICD-10-CM | POA: Diagnosis not present

## 2020-11-03 DIAGNOSIS — M160 Bilateral primary osteoarthritis of hip: Secondary | ICD-10-CM | POA: Diagnosis not present

## 2020-11-03 DIAGNOSIS — Z9181 History of falling: Secondary | ICD-10-CM | POA: Diagnosis not present

## 2020-11-04 ENCOUNTER — Encounter: Payer: Medicare HMO | Admitting: Student

## 2020-11-05 DIAGNOSIS — Z9181 History of falling: Secondary | ICD-10-CM | POA: Diagnosis not present

## 2020-11-05 DIAGNOSIS — I10 Essential (primary) hypertension: Secondary | ICD-10-CM | POA: Diagnosis not present

## 2020-11-05 DIAGNOSIS — M160 Bilateral primary osteoarthritis of hip: Secondary | ICD-10-CM | POA: Diagnosis not present

## 2020-11-05 DIAGNOSIS — I951 Orthostatic hypotension: Secondary | ICD-10-CM | POA: Diagnosis not present

## 2020-11-10 ENCOUNTER — Telehealth: Payer: Self-pay | Admitting: Student

## 2020-11-10 NOTE — Telephone Encounter (Signed)
TOC HFU APPT [facility] 11/13/2020 @ 3:15 PM WITH DR [facility]

## 2020-11-13 ENCOUNTER — Encounter: Payer: Self-pay | Admitting: Internal Medicine

## 2020-11-13 ENCOUNTER — Ambulatory Visit (INDEPENDENT_AMBULATORY_CARE_PROVIDER_SITE_OTHER): Payer: Medicare HMO | Admitting: Internal Medicine

## 2020-11-13 VITALS — BP 126/49 | HR 75 | Temp 97.9°F | Ht 63.0 in | Wt 138.8 lb

## 2020-11-13 DIAGNOSIS — N179 Acute kidney failure, unspecified: Secondary | ICD-10-CM | POA: Diagnosis not present

## 2020-11-13 DIAGNOSIS — W19XXXD Unspecified fall, subsequent encounter: Secondary | ICD-10-CM

## 2020-11-13 DIAGNOSIS — W19XXXA Unspecified fall, initial encounter: Secondary | ICD-10-CM | POA: Insufficient documentation

## 2020-11-13 DIAGNOSIS — R413 Other amnesia: Secondary | ICD-10-CM | POA: Diagnosis not present

## 2020-11-13 DIAGNOSIS — M1611 Unilateral primary osteoarthritis, right hip: Secondary | ICD-10-CM

## 2020-11-13 NOTE — Progress Notes (Signed)
CC: Memory difficulty  HPI: [name] is a 75 y.o. with PMH listed below presenting with complaint of memory issues. Please see problem based assessment and plan for further details.  Past Medical History:  Diagnosis Date  . Arthritis    Osteoarthritis -hips  . Edema extremities    lower extremities flutuates- decreases if elevated.  . Family history of adverse reaction to anesthesia    awakens slowly from anesthesia  . Hypertension    Past Surgical History R hip replacement  Family History Father had some kind of cancer at old age  Social History Live with daughter. Walker and a cane. Denies alcohol, tobacco, illicit drug. Worked as a Geologist, engineering.  Review of Systems: Review of Systems  Constitutional: Negative for chills, fever and malaise/fatigue.  Eyes: Negative for blurred vision.  Respiratory: Negative for shortness of breath.   Cardiovascular: Negative for chest pain, palpitations and leg swelling.  Gastrointestinal: Negative for constipation, diarrhea, nausea and vomiting.  Neurological: Negative for dizziness, tingling and headaches.  Psychiatric/Behavioral: Negative for depression. The patient is not nervous/anxious.   All other systems reviewed and are negative.   Physical Exam: Vitals:   11/13/20 1534  BP: (!) 126/49  Pulse: 75  Temp: 97.9 F (36.6 C)  TempSrc: Oral  SpO2: 100%  Weight: 138 lb 12.8 oz (63 kg)  Height: 5\' 3"  (1.6 m)   Gen: Well-developed, well nourished, NAD HEENT: NCAT head, hearing intact CV: RRR, S1, S2 normal Pulm: CTAB, No rales, no wheezes Extm: ROM intact, Peripheral pulses intact, No peripheral edema Skin: Dry, Warm, normal turgor, no wounds, no rashes, no lesions Psych: Appropriate Mood and Affect Neuro: AAOx1 to name only  Assessment & Plan:   Fall [name] is a 75 yo F w/ PMH of HTN presenting to [facility] to establish care after recent hospitalization after fall. She is accompanied She was found to have  AKI, found down at home. Suspected to have orthostatic hypotension and discharged with recommendation to d/c bp meds and increase oral intake. Since discharge, she has not had any falls, light-headedness, dizziness, blurry vision or dizziness. Denies significant pain or discomfort.  A/P Present for f/u after hospitalization after fall. Appear to have recovered well. No further incidence of fall. Noted to have small hematoma on area of trauma. Noted to have slight hgb drop although suspected to have baseline hgb around 10-11. Will repeat cbc this visit.  - cbc  Memory deficit Family with patient mentions concern regarding progressively worsening memory issue for [name]. Mentions having difficulty with retaining new information and confusing family members. Denies wandering, inappropriate medication administration or self-harm. Denies any visual/audio/tactile hallucinations or personality changes. Mentions having close social support with daughter and other extended family.  A/p MOCA performed with score of 8/30 consistent with severe cognitive impairment. Currently AAOX1 to name only and unable to describe location or date/time. Attempted to speak with daughter who is primary caretaker but she did not pick up. - Advised on importance of social contact and regular exercise - Will discuss with primary caretaker regarding treatment options  AKI (acute kidney injury) (HCC) Noted to have AKI on recent admission that quickly resolved with fluid resuscitation. Presumed to be due to dehydration after fall. Recovered by admission.  - Recheck bmp  OA (osteoarthritis) of hip Stable. Not endorsing significant pain during this visit. Able to ambulate with assistance of walker. Previous hip surgery on R.   - Monitor   Patient discussed with Dr. 9/30  -  Judeth Cornfield, PGY3 [facility] Internal Medicine Pager: [phone]

## 2020-11-13 NOTE — Assessment & Plan Note (Signed)
Family with patient mentions concern regarding progressively worsening memory issue for Mrs.[place]. Mentions having difficulty with retaining new information and confusing family members. Denies wandering, inappropriate medication administration or self-harm. Denies any visual/audio/tactile hallucinations or personality changes. Mentions having close social support with daughter and other extended family.  A/p MOCA performed with score of 8/30 consistent with severe cognitive impairment. Currently AAOX1 to name only and unable to describe location or date/time. Attempted to speak with daughter who is primary caretaker but she did not pick up. - Advised on importance of social contact and regular exercise - Will discuss with primary caretaker regarding treatment options

## 2020-11-13 NOTE — Patient Instructions (Signed)
Thank you for allowing [place] to provide your care today. Today we discussed your memory issues    I have ordered cbc, bmp, tsh, b12 labs for you. I will call if any are abnormal.    Today we made no changes to your medications.    Please follow-up as needed.    Should you have any questions or concerns please call the internal medicine clinic at [phone].     Dementia Dementia is a condition that affects the way the brain works. It often affects memory and thinking. There are many types of dementia. Some types get worse with time and cannot be reversed. Some types of dementia include:  Alzheimer's disease. This is the most common type.  Vascular dementia. This type may happen due to a stroke.  Lewy body dementia. This type may happen to people who have Parkinson's disease.  Frontotemporal dementia. This type is caused by damage to nerve cells in certain parts of the brain. Some people may have more than one type. What are the causes? This condition is caused by damage to cells in the brain. Some causes that cannot be reversed include:  Having a condition that affects the blood vessels of the brain, such as diabetes, heart disease, or blood vessel disease.  Changes to genes. Some causes that can be reversed or slowed include:  Injury to the brain.  Certain medicines.  Infection.  Not having enough vitamin B12 in the body, or thyroid problems.  A tumor, blood clot, or too much fluid in the brain.  Certain diseases that cause your body's defense system (immune system) to attack healthy parts of the body. What are the signs or symptoms?  Problems remembering events or people.  Having trouble taking a bath or putting clothes on.  Forgetting appointments.  Forgetting to pay bills.  Trouble planning and making meals.  Having trouble speaking.  Getting lost easily.  Changes in behavior or mood. How is this treated? Treatment depends on the cause of the dementia. It  might include:  Taking medicines for symptoms or to help control or slow down the dementia.  Treating the cause of your dementia. Your doctor can help you find support groups and other doctors who can help with your care. Follow these instructions at home: Medicines  Take over-the-counter and prescription medicines only as told by your doctor.  Use a pill organizer to help you manage your medicines.  Avoidtaking medicines for pain or for sleep. Lifestyle  Make healthy choices: ? Be active as told by your doctor. ? Do not smoke or use any products that contain nicotine or tobacco. If you need help quitting, ask your doctor. ? Do not drink alcohol. ? When you get stressed, do something that will help you relax. Your doctor can give you tips. ? Spend time with other people.  Make sure you get good sleep. To get good sleep: ? Try not to take naps during the day. ? Keep your bedroom dark and cool. ? In the few hours before you go to bed, try not to do any exercise. ? Do not have foods and drinks with caffeine at night. Eating and drinking  Drink enough fluid to keep your pee (urine) pale yellow.  Eat a healthy diet. General instructions  Talk with your doctor to figure out: ? What you need help with. ? What your safety needs are.  Ask your doctor if it is safe for you to drive.  If told, wear a bracelet  that tracks where you are or shows that you are a person with memory loss.  Work with your family to make big decisions.  Keep all follow-up visits.   Where to find more information  Alzheimer's Association: LimitLaws.hu  [employer] on Aging: CashCowGambling.be  World Health Organization: https://castaneda-walker.com/ Contact a doctor if:  You have any new symptoms.  Your symptoms get worse.  You have problems with swallowing or choking. Get help right away if:  You feel very sad, or feel that you want to harm yourself.  Your family members are worried for your  safety. Get help right away if you feel like you may hurt yourself or others, or have thoughts about taking your own life. Go to your nearest emergency room or:  Call your local emergency services (911 in the [place]  Call the National Suicide Prevention Lifeline at 240 030 7462. This is open 24 hours a day.  Text the Crisis Text Line at (850)777-1988. Summary  Dementia often affects memory and thinking.  Some types of dementia get worse with time and cannot be reversed.  Treatment for this condition depends on the cause.  Talk with your doctor to figure out what you need help with.  Your doctor can help you find support groups and other doctors who can help with your care. This information is not intended to replace advice given to you by your health care provider. Make sure you discuss any questions you have with your health care provider. Document Revised: 12/03/2019 Document Reviewed: 12/03/2019 Elsevier Patient Education  2021 [employer].

## 2020-11-13 NOTE — Assessment & Plan Note (Addendum)
Noted to have AKI on recent admission that quickly resolved with fluid resuscitation. Presumed to be due to dehydration after fall. Recovered by admission.  - Recheck bmp

## 2020-11-13 NOTE — Assessment & Plan Note (Signed)
Victoria Owens is a 75 yo F w/ PMH of HTN presenting to [facility] to establish care after recent hospitalization after fall. She is accompanied She was found to have AKI, found down at home. Suspected to have orthostatic hypotension and discharged with recommendation to d/c bp meds and increase oral intake. Since discharge, she has not had any falls, light-headedness, dizziness, blurry vision or dizziness. Denies significant pain or discomfort.  A/P Present for f/u after hospitalization after fall. Appear to have recovered well. No further incidence of fall. Noted to have small hematoma on area of trauma. Noted to have slight hgb drop although suspected to have baseline hgb around 10-11. Will repeat cbc this visit.  - cbc

## 2020-11-13 NOTE — Assessment & Plan Note (Signed)
Stable. Not endorsing significant pain during this visit. Able to ambulate with assistance of walker. Previous hip surgery on R.   - Monitor

## 2020-11-14 DIAGNOSIS — Z9181 History of falling: Secondary | ICD-10-CM | POA: Diagnosis not present

## 2020-11-14 DIAGNOSIS — M160 Bilateral primary osteoarthritis of hip: Secondary | ICD-10-CM | POA: Diagnosis not present

## 2020-11-14 DIAGNOSIS — I951 Orthostatic hypotension: Secondary | ICD-10-CM | POA: Diagnosis not present

## 2020-11-14 DIAGNOSIS — I10 Essential (primary) hypertension: Secondary | ICD-10-CM | POA: Diagnosis not present

## 2020-11-14 LAB — BMP8+ANION GAP
Anion Gap: 15 mmol/L (ref 10.0–18.0)
BUN/Creatinine Ratio: 18 (ref 12–28)
BUN: 18 mg/dL (ref 8–27)
CO2: 25 mmol/L (ref 20–29)
Calcium: 9.7 mg/dL (ref 8.7–10.3)
Chloride: 104 mmol/L (ref 96–106)
Creatinine, Ser: 0.99 mg/dL (ref 0.57–1.00)
Glucose: 86 mg/dL (ref 65–99)
Potassium: 4.4 mmol/L (ref 3.5–5.2)
Sodium: 144 mmol/L (ref 134–144)
eGFR: 59 mL/min/{1.73_m2} — ABNORMAL LOW (ref >59)

## 2020-11-14 LAB — VITAMIN B12: Vitamin B-12: 821 pg/mL (ref 232–1245)

## 2020-11-14 LAB — CBC
Hematocrit: 33.8 % — ABNORMAL LOW (ref 34.0–46.6)
Hemoglobin: 11.2 g/dL (ref 11.1–15.9)
MCH: 29.4 pg (ref 26.6–33.0)
MCHC: 33.1 g/dL (ref 31.5–35.7)
MCV: 89 fL (ref 79–97)
Platelets: 421 10*3/uL (ref 150–450)
RBC: 3.81 x10E6/uL (ref 3.77–5.28)
RDW: 13.4 % (ref 11.7–15.4)
WBC: 5.4 10*3/uL (ref 3.4–10.8)

## 2020-11-14 LAB — TSH: TSH: 2.91 u[IU]/mL (ref 0.450–4.500)

## 2020-11-17 ENCOUNTER — Telehealth: Payer: Self-pay | Admitting: Internal Medicine

## 2020-11-17 NOTE — Telephone Encounter (Signed)
Spoke with [name] daughter regarding recent visit and labs. Discussed risk and benefit of starting therapy for Alzheimer's which she mentions she will consider. She discuss concern regarding Victoria Owens's ankle swelling. Advised to get weight so we can compare to recent weight in clinic. Advised to also monitor for clinical signs of respiratory distress and discussed red-flag sxs.

## 2020-11-17 NOTE — Progress Notes (Signed)
Internal Medicine Clinic Attending  Case discussed with Dr. Lee  At the time of the visit.  We reviewed the resident's history and exam and pertinent patient test results.  I agree with the assessment, diagnosis, and plan of care documented in the resident's note.    

## 2020-11-17 NOTE — Telephone Encounter (Signed)
Pt's daugther called to report that since the pt's visit on 11/13/2020 both feet are now swollen.  The Pt's right foot is swollen more than the left foot.  Pt's daughter is also concerned about her mother's kidney functions.   Pt's daughter concerned about when her mother needs to f/u with an appointment  Pt's daughter is also following up with the pt's lab results from 11/13/2020 as well.

## 2020-11-17 NOTE — Telephone Encounter (Signed)
RTC to patient's (EC), Victoria Owens, VM obtained and VM box was full, RN unable to leave a message.  Dr. Nedra Hai, Do you remember if this patient was planning on transferring care over to [facility]?  Do you need to speak with her/EC regarding labs or shall she f/u with her PCP? Thanks, SChaplin, RN,BSN

## 2020-11-17 NOTE — Telephone Encounter (Signed)
TC, 2nd attempt to reach [facility], Michelle.  She states she was calling to speak to Dr. Nedra Hai and she wants to discuss the lab results, has some questions regarding labs and mom's last b/p (dialstolic in the 40's at LOV).  She states her mom's ankles are swollen today, denies her mother having any SOB.  She states she is going to work soon and is requesting a call soon if at all possible.  Will forward to Dr. Nedra Hai. SChaplin, RN,BSN

## 2020-11-17 NOTE — Telephone Encounter (Signed)
Yes her family wanted her to establish care with [place].

## 2020-11-20 ENCOUNTER — Telehealth: Payer: Self-pay

## 2020-11-20 NOTE — Telephone Encounter (Signed)
[facility]  is requesting a call back about pt referral

## 2020-11-21 NOTE — Telephone Encounter (Signed)
Forwarded Referral to the [facility] team of Mamie and Lauren to Address.

## 2020-11-24 ENCOUNTER — Telehealth: Payer: Self-pay

## 2020-11-24 NOTE — Telephone Encounter (Signed)
Error

## 2020-11-26 ENCOUNTER — Encounter: Payer: Medicare HMO | Admitting: Internal Medicine

## 2020-11-28 DIAGNOSIS — Z9181 History of falling: Secondary | ICD-10-CM | POA: Diagnosis not present

## 2020-11-28 DIAGNOSIS — I10 Essential (primary) hypertension: Secondary | ICD-10-CM | POA: Diagnosis not present

## 2020-11-28 DIAGNOSIS — M160 Bilateral primary osteoarthritis of hip: Secondary | ICD-10-CM | POA: Diagnosis not present

## 2020-11-28 DIAGNOSIS — I951 Orthostatic hypotension: Secondary | ICD-10-CM | POA: Diagnosis not present

## 2020-12-04 DIAGNOSIS — I951 Orthostatic hypotension: Secondary | ICD-10-CM | POA: Diagnosis not present

## 2020-12-04 DIAGNOSIS — Z9181 History of falling: Secondary | ICD-10-CM | POA: Diagnosis not present

## 2020-12-04 DIAGNOSIS — I10 Essential (primary) hypertension: Secondary | ICD-10-CM | POA: Diagnosis not present

## 2020-12-04 DIAGNOSIS — M160 Bilateral primary osteoarthritis of hip: Secondary | ICD-10-CM | POA: Diagnosis not present

## 2020-12-10 ENCOUNTER — Encounter: Payer: Medicare HMO | Admitting: Internal Medicine

## 2021-06-01 ENCOUNTER — Telehealth: Payer: Self-pay | Admitting: *Deleted

## 2021-06-01 NOTE — Telephone Encounter (Signed)
Patient's daughter called in stating she needs [facility] RN to come to home. States patient has left hip pain and fall, and  now is immobile, in bed, incontinent of urine, poor appetite. Has developed bed sore above sacrum (10/29 area was red, by 10/30 "there is a hole"), and on ankle. Patient's only visit was on 4/14 with 4 week return request. Explained importance of having patient brought in for evaluation. Discussed using [facility]  [phone]) for transportation. Also, discussed [employer] calls [phone]). Appt made for 11/2 at 9:45. She will call back to cancel this appt if she decides to go with [facility]. Discussed turning patient at least q 2 hours, changing depends as soon as they are wet, and using collagen bandage on sacrum.

## 2021-06-03 ENCOUNTER — Encounter: Payer: Medicare HMO | Admitting: Internal Medicine

## 2021-06-06 DIAGNOSIS — I10 Essential (primary) hypertension: Secondary | ICD-10-CM | POA: Diagnosis not present

## 2021-06-06 DIAGNOSIS — L89602 Pressure ulcer of unspecified heel, stage 2: Secondary | ICD-10-CM | POA: Diagnosis not present

## 2021-06-06 DIAGNOSIS — R000 Tachycardia, unspecified: Secondary | ICD-10-CM | POA: Diagnosis not present

## 2021-06-06 DIAGNOSIS — T148XXA Other injury of unspecified body region, initial encounter: Secondary | ICD-10-CM | POA: Diagnosis not present

## 2021-06-06 DIAGNOSIS — E43 Unspecified severe protein-calorie malnutrition: Secondary | ICD-10-CM | POA: Diagnosis not present

## 2021-06-06 DIAGNOSIS — L89154 Pressure ulcer of sacral region, stage 4: Secondary | ICD-10-CM | POA: Diagnosis not present

## 2021-06-12 DIAGNOSIS — T8130XA Disruption of wound, unspecified, initial encounter: Secondary | ICD-10-CM | POA: Diagnosis not present

## 2021-06-12 DIAGNOSIS — M6281 Muscle weakness (generalized): Secondary | ICD-10-CM | POA: Diagnosis not present

## 2021-06-12 DIAGNOSIS — I10 Essential (primary) hypertension: Secondary | ICD-10-CM | POA: Diagnosis not present

## 2021-06-27 ENCOUNTER — Emergency Department (HOSPITAL_COMMUNITY)
Admission: EM | Admit: 2021-06-27 | Discharge: 2021-07-02 | Disposition: E | Payer: Medicare HMO | Attending: Emergency Medicine | Admitting: Emergency Medicine

## 2021-06-27 DIAGNOSIS — I499 Cardiac arrhythmia, unspecified: Secondary | ICD-10-CM | POA: Diagnosis not present

## 2021-06-27 DIAGNOSIS — R404 Transient alteration of awareness: Secondary | ICD-10-CM | POA: Diagnosis not present

## 2021-06-27 DIAGNOSIS — I469 Cardiac arrest, cause unspecified: Secondary | ICD-10-CM | POA: Diagnosis not present

## 2021-06-27 DIAGNOSIS — Z79899 Other long term (current) drug therapy: Secondary | ICD-10-CM | POA: Insufficient documentation

## 2021-06-27 DIAGNOSIS — Z96641 Presence of right artificial hip joint: Secondary | ICD-10-CM | POA: Diagnosis not present

## 2021-06-27 DIAGNOSIS — I10 Essential (primary) hypertension: Secondary | ICD-10-CM | POA: Insufficient documentation

## 2021-06-27 DIAGNOSIS — Z743 Need for continuous supervision: Secondary | ICD-10-CM | POA: Diagnosis not present

## 2021-06-27 DIAGNOSIS — R0689 Other abnormalities of breathing: Secondary | ICD-10-CM | POA: Diagnosis not present

## 2021-06-27 LAB — COMPREHENSIVE METABOLIC PANEL
ALT: 542 U/L — ABNORMAL HIGH (ref 0–44)
AST: 1030 U/L — ABNORMAL HIGH (ref 15–41)
Albumin: 1.7 g/dL — ABNORMAL LOW (ref 3.5–5.0)
Alkaline Phosphatase: 91 U/L (ref 38–126)
Anion gap: 24 — ABNORMAL HIGH (ref 5–15)
BUN: 35 mg/dL — ABNORMAL HIGH (ref 8–23)
CO2: 12 mmol/L — ABNORMAL LOW (ref 22–32)
Calcium: 8.8 mg/dL — ABNORMAL LOW (ref 8.9–10.3)
Chloride: 104 mmol/L (ref 98–111)
Creatinine, Ser: 1.11 mg/dL — ABNORMAL HIGH (ref 0.44–1.00)
GFR, Estimated: 52 mL/min — ABNORMAL LOW (ref >60)
Glucose, Bld: 252 mg/dL — ABNORMAL HIGH (ref 70–99)
Potassium: 4.2 mmol/L (ref 3.5–5.1)
Sodium: 140 mmol/L (ref 135–145)
Total Bilirubin: 0.2 mg/dL — ABNORMAL LOW (ref 0.3–1.2)
Total Protein: 4.3 g/dL — ABNORMAL LOW (ref 6.5–8.1)

## 2021-06-27 LAB — CBC WITH DIFFERENTIAL/PLATELET
Abs Immature Granulocytes: 0.3 10*3/uL — ABNORMAL HIGH (ref 0.00–0.07)
Band Neutrophils: 2 %
Basophils Absolute: 0.1 10*3/uL (ref 0.0–0.1)
Basophils Relative: 1 %
Eosinophils Absolute: 0 10*3/uL (ref 0.0–0.5)
Eosinophils Relative: 0 %
HCT: 29 % — ABNORMAL LOW (ref 36.0–46.0)
Hemoglobin: 8.1 g/dL — ABNORMAL LOW (ref 12.0–15.0)
Lymphocytes Relative: 90 %
Lymphs Abs: 10.4 10*3/uL — ABNORMAL HIGH (ref 0.7–4.0)
MCH: 26.6 pg (ref 26.0–34.0)
MCHC: 27.9 g/dL — ABNORMAL LOW (ref 30.0–36.0)
MCV: 95.4 fL (ref 80.0–100.0)
Metamyelocytes Relative: 2 %
Monocytes Absolute: 0 10*3/uL — ABNORMAL LOW (ref 0.1–1.0)
Monocytes Relative: 0 %
Myelocytes: 1 %
Neutro Abs: 0.7 10*3/uL — ABNORMAL LOW (ref 1.7–7.7)
Neutrophils Relative %: 4 %
Platelets: 240 10*3/uL (ref 150–400)
RBC: 3.04 MIL/uL — ABNORMAL LOW (ref 3.87–5.11)
RDW: 15.1 % (ref 11.5–15.5)
WBC: 11.6 10*3/uL — ABNORMAL HIGH (ref 4.0–10.5)
nRBC: 0.5 % — ABNORMAL HIGH (ref 0.0–0.2)

## 2021-06-27 LAB — I-STAT CHEM 8, ED
BUN: 38 mg/dL — ABNORMAL HIGH (ref 8–23)
Calcium, Ion: 1.16 mmol/L (ref 1.15–1.40)
Chloride: 106 mmol/L (ref 98–111)
Creatinine, Ser: 0.9 mg/dL (ref 0.44–1.00)
Glucose, Bld: 239 mg/dL — ABNORMAL HIGH (ref 70–99)
HCT: 27 % — ABNORMAL LOW (ref 36.0–46.0)
Hemoglobin: 9.2 g/dL — ABNORMAL LOW (ref 12.0–15.0)
Potassium: 3.9 mmol/L (ref 3.5–5.1)
Sodium: 138 mmol/L (ref 135–145)
TCO2: 16 mmol/L — ABNORMAL LOW (ref 22–32)

## 2021-06-27 LAB — CBG MONITORING, ED: Glucose-Capillary: 190 mg/dL — ABNORMAL HIGH (ref 70–99)

## 2021-06-27 LAB — LACTIC ACID, PLASMA: Lactic Acid, Venous: >9 mmol/L (ref 0.5–1.9)

## 2021-06-27 MED ORDER — EPINEPHRINE 1 MG/10ML IJ SOSY
PREFILLED_SYRINGE | INTRAMUSCULAR | Status: DC | PRN
  Administered 2021-06-27: 1 mg via INTRAVENOUS

## 2021-06-27 MED ORDER — EPINEPHRINE 1 MG/10ML IJ SOSY
PREFILLED_SYRINGE | INTRAMUSCULAR | Status: DC | PRN
  Administered 2021-06-27 (×2): 1 mg via INTRAVENOUS

## 2021-07-01 LAB — PATHOLOGIST SMEAR REVIEW

## 2021-07-02 NOTE — Code Documentation (Signed)
Lost pulses, resuming CPR

## 2021-07-02 NOTE — Code Documentation (Signed)
Bedside FAST- no organized movementl

## 2021-07-02 NOTE — Code Documentation (Signed)
Patient time of death occurred at 34

## 2021-07-02 NOTE — Progress Notes (Signed)
   07/04/21 1900  Clinical Encounter Type  Visited With Patient and family together  Visit Type Initial;Social support;Spiritual support;Psychological support;Death  Referral From Nurse  Consult/Referral To Chaplain  Spiritual Encounters  Spiritual Needs Sacred text;Prayer;Ritual;Grief support  Stress Factors  Patient Stress Factors None identified  Family Stress Factors Financial concerns;Loss;Loss of control;Major life changes   Chaplain met with patient and family in waiting room for giref support.  Attended to family needs of four members- daughter Selinda Eon, and grand children.  Grief support.  Escroted family to [place] room, Will remain available as needed

## 2021-07-02 NOTE — ED Provider Notes (Signed)
[facility] EMERGENCY DEPARTMENT Provider Note   CSN: [id number] Arrival date & time: 13-Jul-2021  1752     History No chief complaint on file.   Victoria Owens is a 75 y.o. female.  Pt presents to the ED today as cardiac arrest.  The pt lives at home.  Her daughter said she was changing her and she cried out, made a noise, and stopped breathing.  The son-in-law started CPR.  When EMS arrived, she was in PEA.  EMS gave several rounds of epi.  She briefly had ROSC.  She was intubated by EMS with a king airway.  She had an IO placed in her right tibia.  Pt is not ambulatory at baseline.      Past Medical History:  Diagnosis Date   Arthritis    Osteoarthritis -hips   Edema extremities    lower extremities flutuates- decreases if elevated.   Family history of adverse reaction to anesthesia    awakens slowly from anesthesia   Hypertension     Patient Active Problem List   Diagnosis Date Noted   Fall 11/13/2020   Memory deficit 11/13/2020   AKI (acute kidney injury) (HCC) 10/21/2020   OA (osteoarthritis) of hip 11/10/2015    Past Surgical History:  Procedure Laterality Date   TOTAL HIP ARTHROPLASTY Right 11/10/2015   Procedure: RIGHT TOTAL HIP ARTHROPLASTY ANTERIOR APPROACH;  Surgeon: Ollen Gross, MD;  Location: [facility] ORS;  Service: Orthopedics;  Laterality: Right;     OB History   No obstetric history on file.     No family history on file.  Social History   Tobacco Use   Smoking status: Never   Smokeless tobacco: Never  Substance Use Topics   Alcohol use: No   Drug use: No    Home Medications Prior to Admission medications   Medication Sig Start Date End Date Taking? Authorizing Provider  Multiple Vitamins-Minerals (ONE-A-DAY WOMENS 50+) TABS Take 1 tablet by mouth daily.    [provider]    Allergies    Penicillins  Review of Systems   Review of Systems  Unable to perform ROS: Patient unresponsive   Physical  Exam Updated Vital Signs Ht 5\' 3"  (1.6 m)   Wt 50 kg   BMI 19.53 kg/m   Physical Exam Vitals and nursing note reviewed.  Constitutional:      Appearance: She is underweight. She is ill-appearing.     Comments: Pt is pulseless and apneic  HENT:     Right Ear: External ear normal.     Left Ear: External ear normal.     Nose: Nose normal.     Mouth/Throat:     Mouth: Mucous membranes are dry.  Eyes:     Comments: Pupils are fixed and dilated  Neck:     Vascular: No carotid bruit.  Cardiovascular:     Comments: No hr.  CPR by . Pulmonary:     Comments: Bs with king airway bagging Abdominal:     General: Abdomen is flat.  Musculoskeletal:     Comments: RUE with swelling and edema  Skin:    Capillary Refill: Capillary refill takes more than 3 seconds.     Comments: Multiple large sores to body.  Right hip, left knee, sacrum  Neurological:     Mental Status: She is unresponsive.  Psychiatric:     Comments: Unable to assess    ED Results / Procedures / Treatments   Labs (all labs ordered  are listed, but only abnormal results are displayed) Labs Reviewed  COMPREHENSIVE METABOLIC PANEL - Abnormal; Notable for the following components:      Result Value   CO2 12 (*)    Glucose, Bld 252 (*)    BUN 35 (*)    Creatinine, Ser 1.11 (*)    Calcium 8.8 (*)    Total Protein 4.3 (*)    Albumin 1.7 (*)    AST 1,030 (*)    ALT 542 (*)    Total Bilirubin 0.2 (*)    GFR, Estimated 52 (*)    Anion gap 24 (*)    All other components within normal limits  CBC WITH DIFFERENTIAL/PLATELET - Abnormal; Notable for the following components:   WBC 11.6 (*)    RBC 3.04 (*)    Hemoglobin 8.1 (*)    HCT 29.0 (*)    MCHC 27.9 (*)    nRBC 0.5 (*)    Neutro Abs 0.7 (*)    Lymphs Abs 10.4 (*)    Monocytes Absolute 0.0 (*)    Abs Immature Granulocytes 0.30 (*)    All other components within normal limits  LACTIC ACID, PLASMA - Abnormal; Notable for the following components:   Lactic  Acid, Venous >9.0 (*)    All other components within normal limits  CBG MONITORING, ED - Abnormal; Notable for the following components:   Glucose-Capillary 190 (*)    All other components within normal limits  I-STAT CHEM 8, ED - Abnormal; Notable for the following components:   BUN 38 (*)    Glucose, Bld 239 (*)    TCO2 16 (*)    Hemoglobin 9.2 (*)    HCT 27.0 (*)    All other components within normal limits  LACTIC ACID, PLASMA  PATHOLOGIST SMEAR REVIEW    EKG None  Radiology No results found.  Procedures Procedure Name: Intubation Date/Time: 19-Jul-2021 6:46 PM Performed by: Jacalyn Lefevre, MD Laryngoscope Size: Glidescope and 3 Tube size: 7.5 mm Number of attempts: 1 Placement Confirmation: ETT inserted through vocal cords under direct vision Secured at: 23 cm Tube secured with: ETT holder Dental Injury: Teeth and Oropharynx as per pre-operative assessment       Medications Ordered in ED Medications  EPINEPHrine (ADRENALIN) 1 MG/10ML injection (1 mg Intravenous Given 07/19/2021 1759)  EPINEPHrine (ADRENALIN) 1 MG/10ML injection (1 mg Intravenous Given 07/19/21 1755)    ED Course  I have reviewed the triage vital signs and the nursing notes.  Pertinent labs & imaging results that were available during my care of the patient were reviewed by me and considered in my medical decision making (see chart for details).    MDM Rules/Calculators/A&P                           Several more rounds of CPR and epis were given.  Pt had no meaningful response to resuscitation, so code was stopped at 1803.  Pt's PCP is Dr. Annita Brod.  I was finally able to get a hold of him and he did not want to sign the death certificate because he said he's not seen her personally, just one of his staff.  Due to this irregularity, I called the ME and he said we can release the body as I have no suspicion of any suspicious circumstances surrounding the death.  I signed the death  certificate in the [facility] system.  CRITICAL CARE Performed by: Jacalyn Lefevre  Total critical care time: 60 minutes  Critical care time was exclusive of separately billable procedures and treating other patients.  Critical care was necessary to treat or prevent imminent or life-threatening deterioration.  Critical care was time spent personally by me on the following activities: development of treatment plan with patient and/or surrogate as well as nursing, discussions with consultants, evaluation of patient's response to treatment, examination of patient, obtaining history from patient or surrogate, ordering and performing treatments and interventions, ordering and review of laboratory studies, ordering and review of radiographic studies, pulse oximetry and re-evaluation of patient's condition.   Final Clinical Impression(s) / ED Diagnoses Final diagnoses:  Cardiac arrest [facility])    Rx / DC Orders ED Discharge Orders     None        Jacalyn Lefevre, MD Jul 17, 2021 2018

## 2021-07-02 NOTE — Code Documentation (Signed)
Pulse check; asystole, CPR resumed 

## 2021-07-02 NOTE — ED Triage Notes (Signed)
CPR 1723. Pt witnessed arrest by family who started CPR. FD placed pt on zoll, no shock advised. When ems arrived on scene pt in PEA, continued CPR with epi X2, ROSC HR 150. Did 2 more rounds of CPR, lost pulses when in truck, given 2 more rounds of epi, went from PEA to HR 156 with BP 110 systolic. On arrival to treatment room, pt went bradycardic to 40 then lost pulses.  IO R tib, King airway. No recent illness or injury.

## 2021-07-02 NOTE — Code Documentation (Signed)
Pulse check, slow faint pulse.

## 2021-07-02 DEATH — deceased

## 2022-10-24 IMAGING — CR DG HIP (WITH OR WITHOUT PELVIS) 2-3V*L*
3 series · 3 of 3 positions shown · non-contrast
Comparison: Pelvic radiograph dated 11/10/2015.

CLINICAL DATA: 74-year-old female with fall.

EXAM:
DG HIP (WITH OR WITHOUT PELVIS) 2-3V LEFT

[pelvis ap]
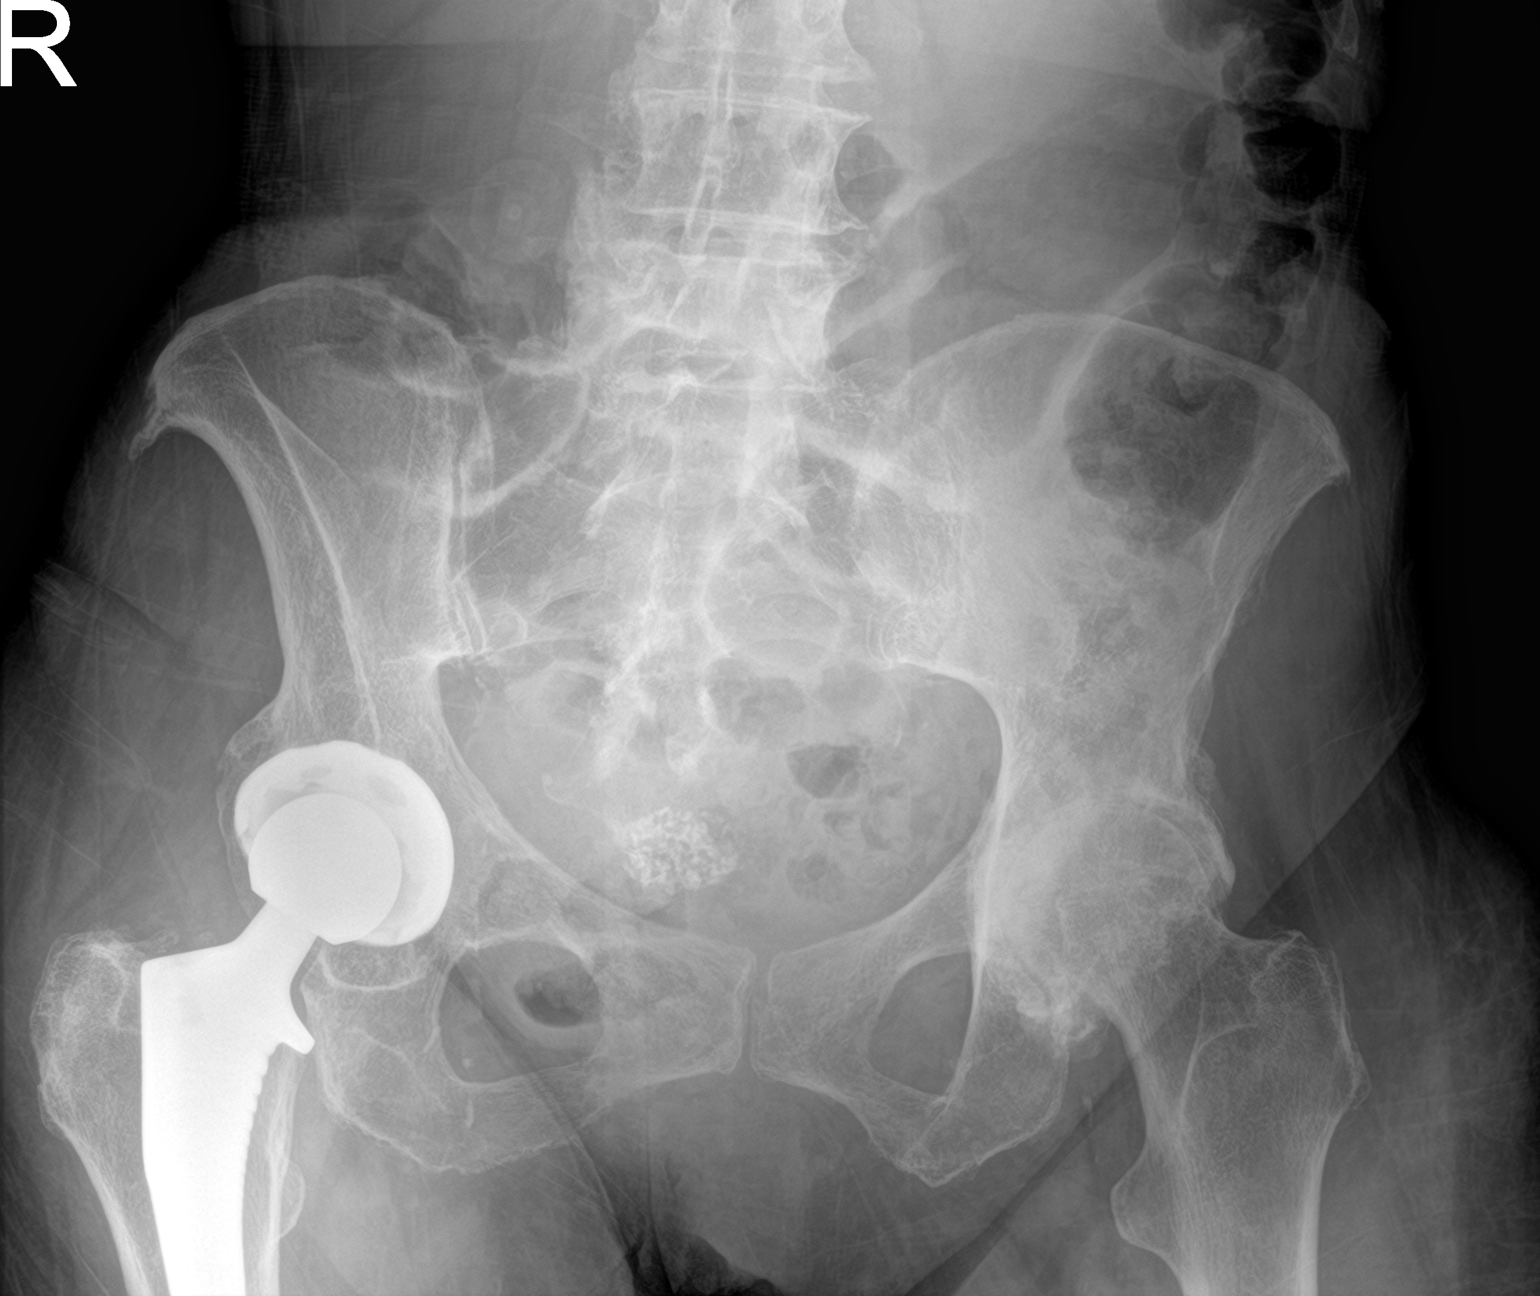

[hip ap]
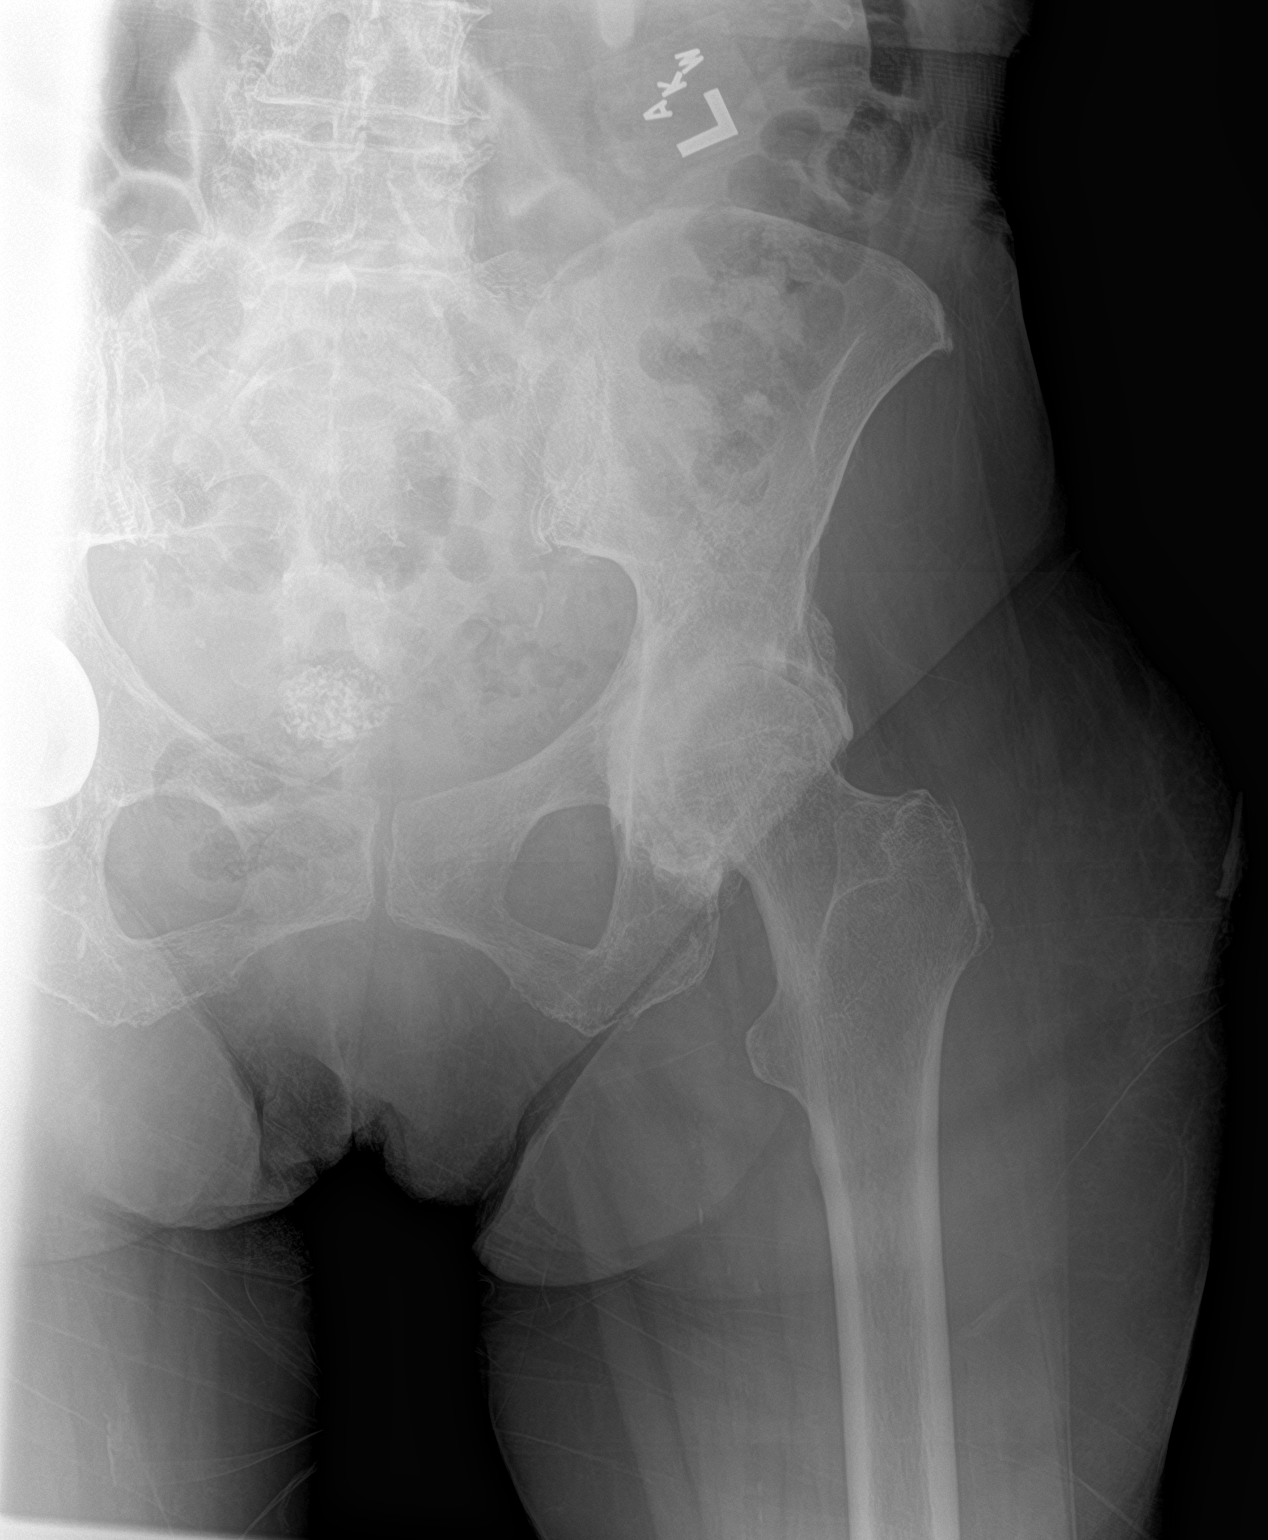

[hip lat]
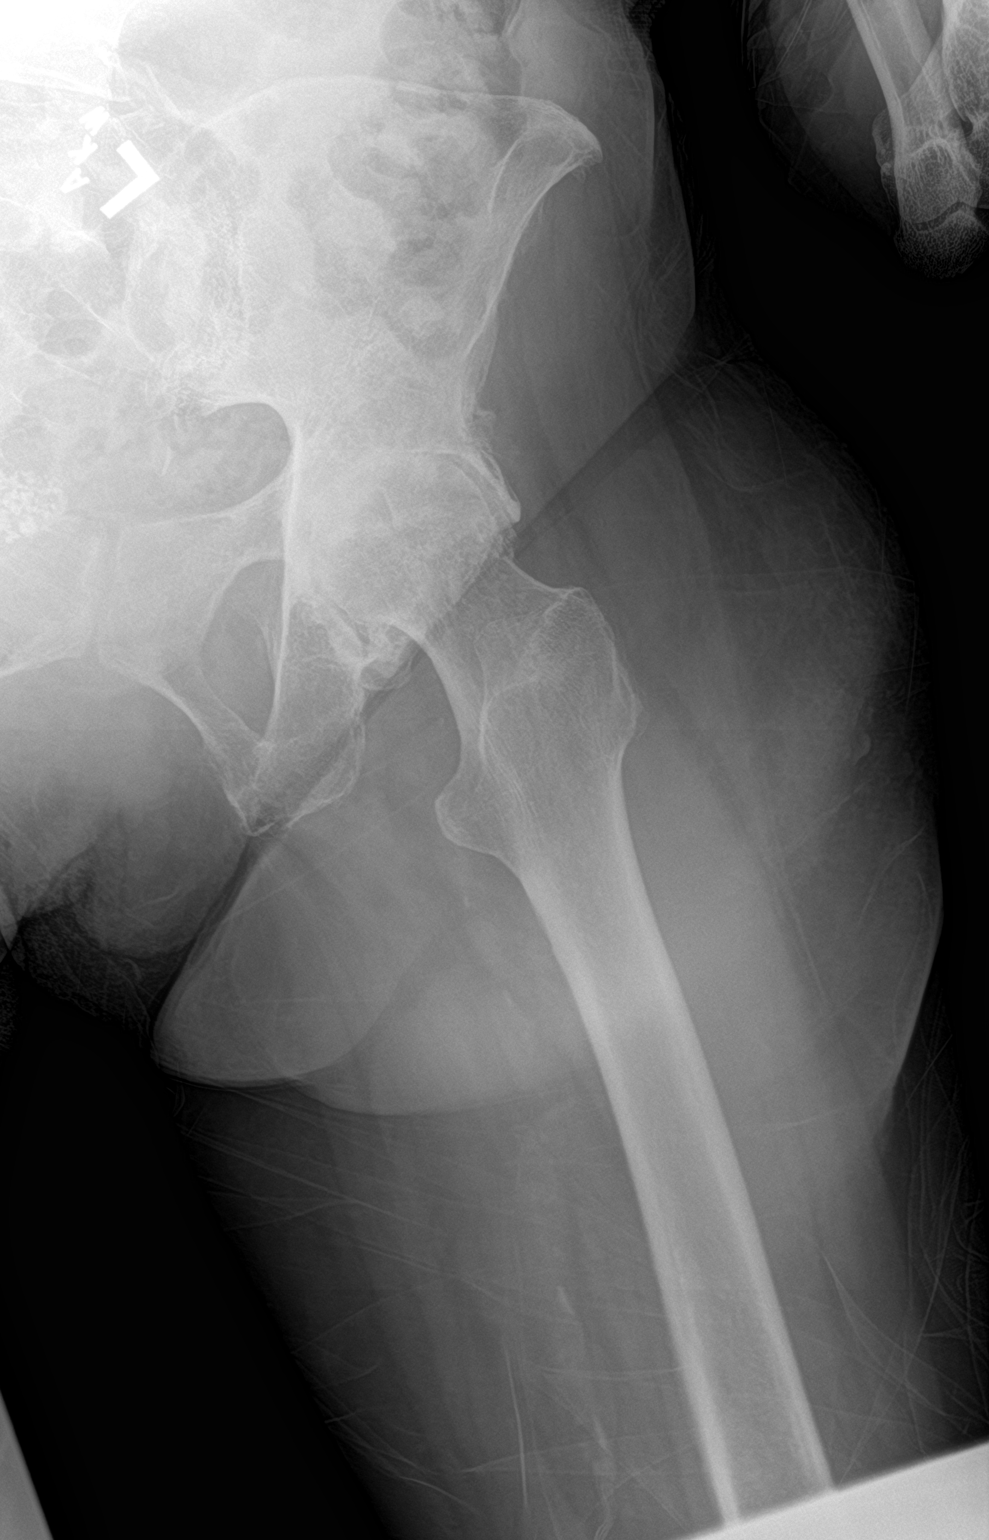

[3 of 3 positions shown; findings below may reference images not displayed]

FINDINGS: There is a total right hip arthroplasty. There is severe
osteoarthritic changes of the left hip with complete loss of joint
space and bone-on-bone contact. There is no acute fracture or
dislocation. The bones are osteopenic. The soft tissues are
unremarkable. Calcified uterine fibroid.
IMPRESSION: 1. No acute fracture or dislocation.
2. Severe osteoarthritic changes of the left hip.

## 2022-10-24 IMAGING — CT CT HIP*L* W/O CM
2 of 4 series · 16 of 46 positions shown, 18 images · non-contrast
Comparison: Radiographs, same date.

CLINICAL DATA: Fell at home.  Left hip pain.

EXAM:
CT OF THE LEFT HIP WITHOUT CONTRAST
TECHNIQUE: Multidetector CT imaging of the left hip was performed according to
the standard protocol. Multiplanar CT image reconstructions were
also generated.

[Series 5: soft tissue · axial · 0.39mm/px · z∈[+838,+1000]mm · 13 of 95 slices shown, 15 images]
[im 7/95  soft-tissue]
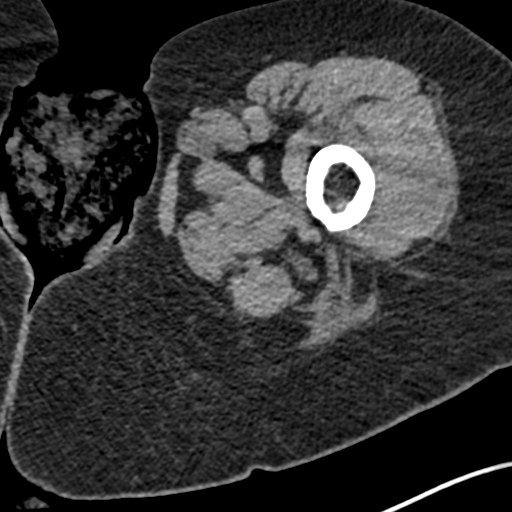
[im 7/95  bone]
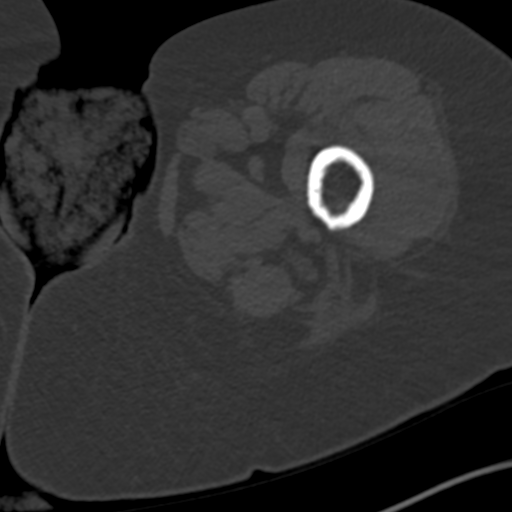
[im 13/95  soft-tissue]
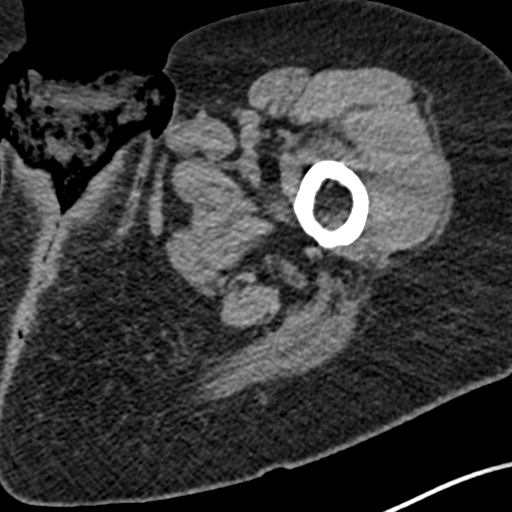
[im 19/95  soft-tissue]
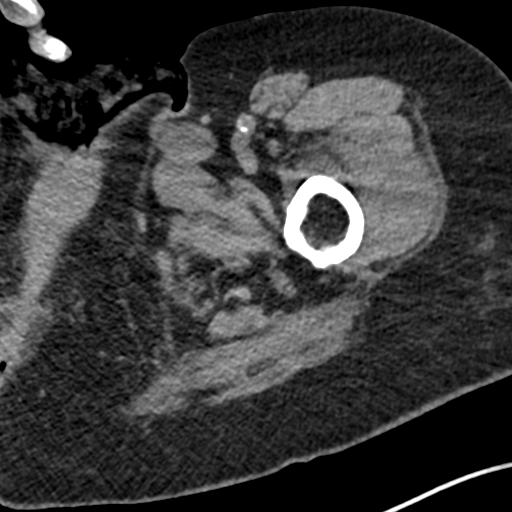
[im 28/95  soft-tissue]
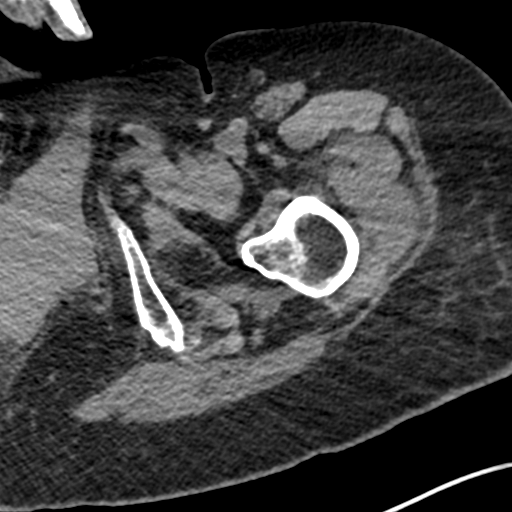
[im 34/95  soft-tissue]
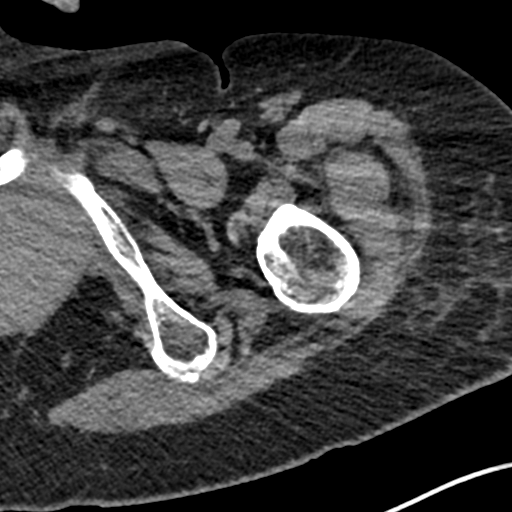
[im 40/95  soft-tissue]
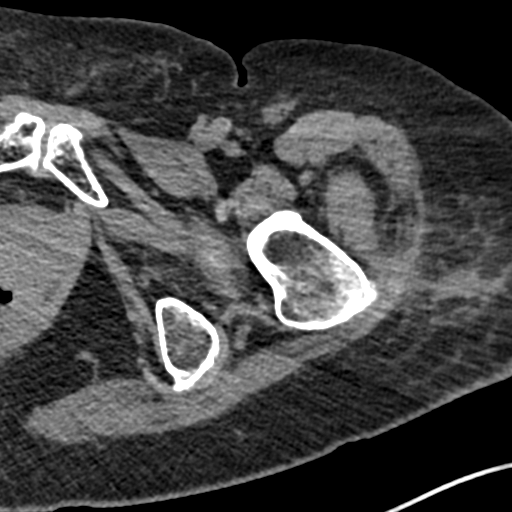
[im 49/95  soft-tissue]
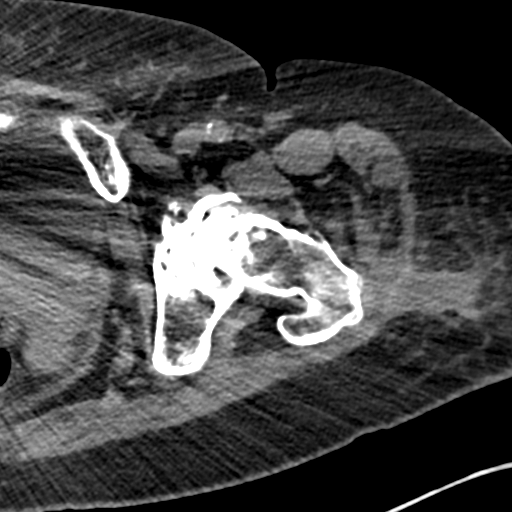
[im 55/95  soft-tissue]
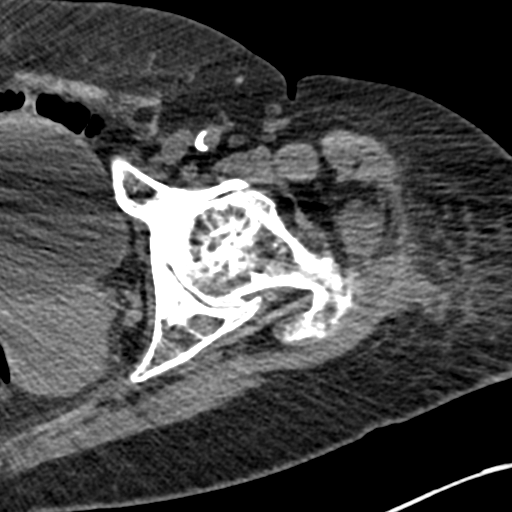
[im 61/95  soft-tissue]
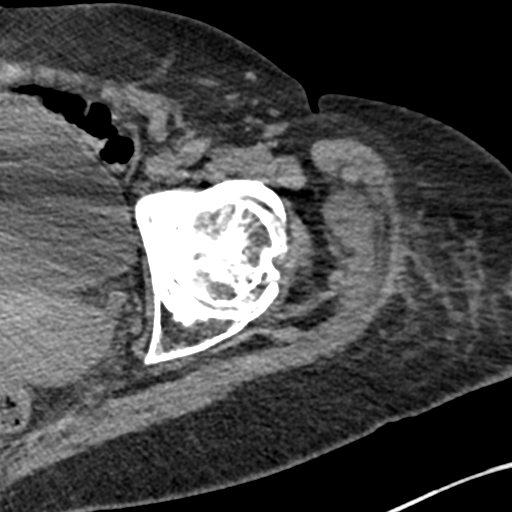
[im 61/95  bone]
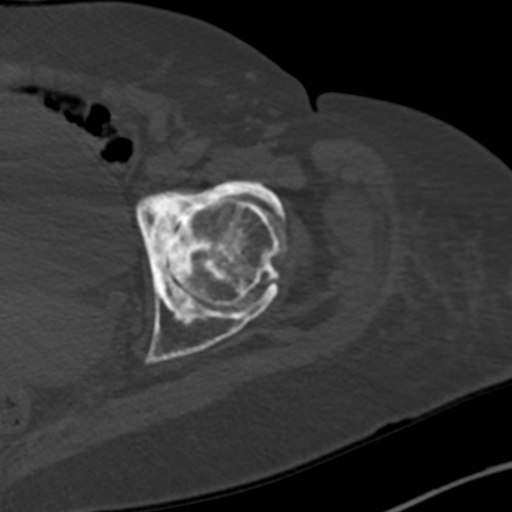
[im 67/95  soft-tissue]
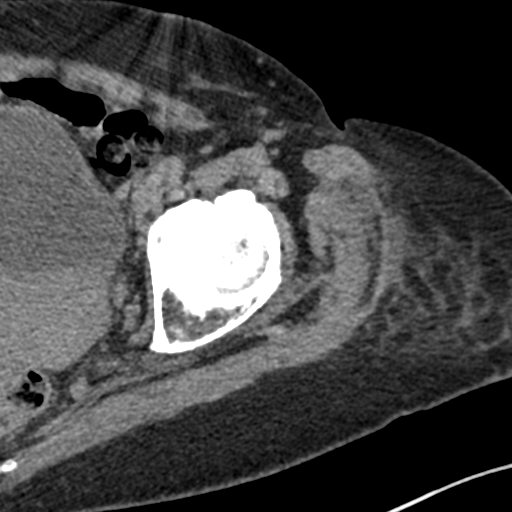
[im 76/95  soft-tissue]
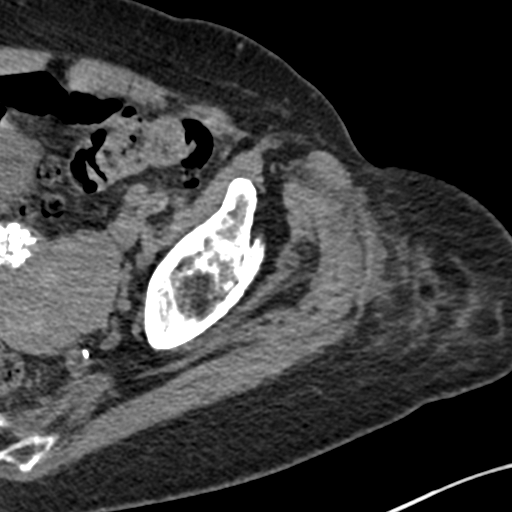
[im 82/95  soft-tissue]
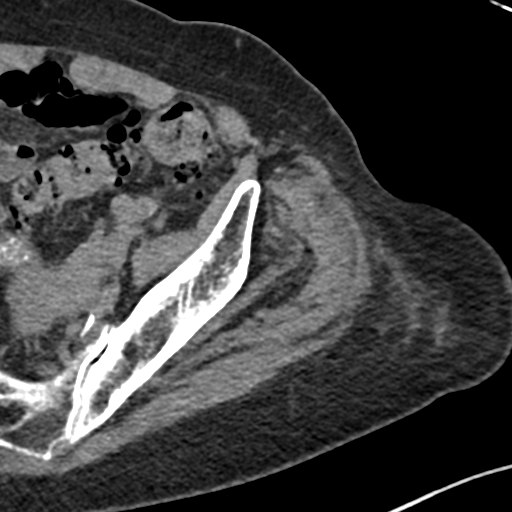
[im 88/95  soft-tissue]
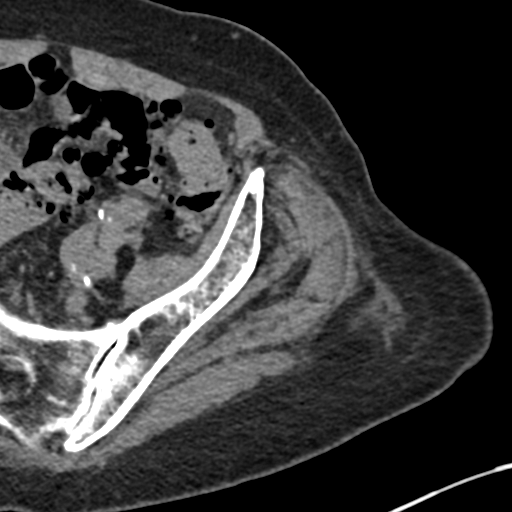

[Series 6: cor soft · coronal · 0.37mm/px · 3 of 122 slices shown]
[im 31/122  soft-tissue]
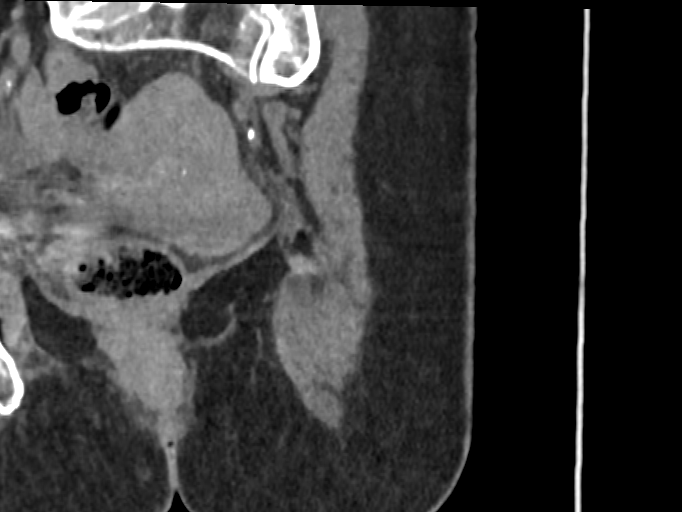
[im 61/122  soft-tissue]
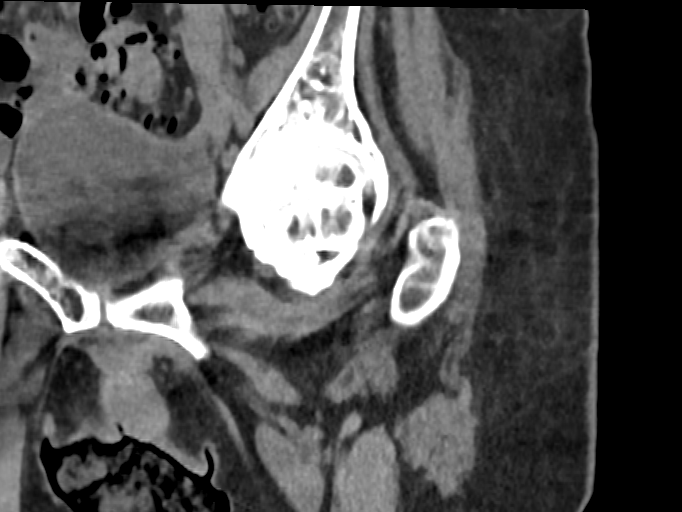
[im 91/122  soft-tissue]
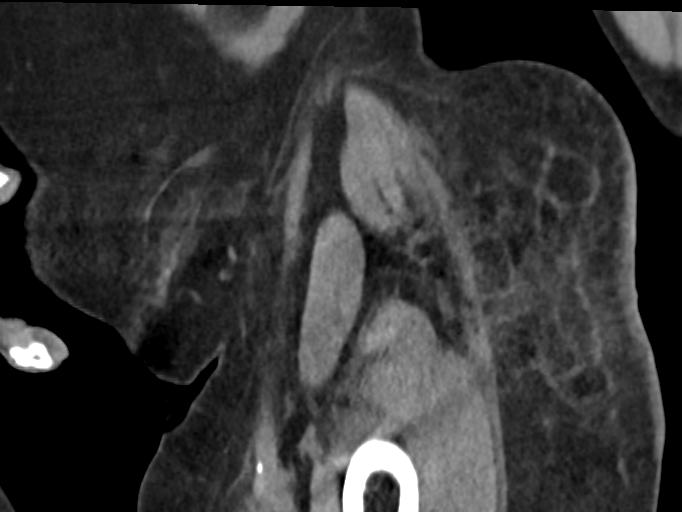

[16 of 46 positions shown; findings below may reference images not displayed]

FINDINGS: The left hip is normally located. Severe degenerative changes with
complete cartilage loss, osteophytic spurring, bony eburnation and
subchondral cystic change. No acute hip fracture is identified. The
left hemipelvis is intact. No pelvic fractures. The pubic symphysis
and left SI joint are intact.

Subcutaneous soft tissue swelling/edema/fluid overlying the region
of the greater trochanter consistent with a contusion. A small
hematoma is noted. The underlying hip musculature is grossly normal.
No obvious muscle tear or intramuscular hematoma.

No significant intrapelvic abnormalities on the left side. A
calcified uterine fibroid is noted incidentally.
IMPRESSION: 1. Severe degenerative changes involving the left hip but no acute
hip fracture or dislocation.
2. Subcutaneous soft tissue swelling/edema/fluid and small hematoma
noted in the subcutaneous tissues overlying the region of the
greater trochanter.
3. No obvious muscle tear/intramuscular hematoma.
